# Patient Record
Sex: Female | Born: 1945 | Race: White | Hispanic: No | Marital: Married | Smoking: Former smoker
Health system: Southern US, Community
[De-identification: ages and names within clinical notes are randomized; demographics above are authoritative.]

## PROBLEM LIST (undated history)

## (undated) DIAGNOSIS — I10 Essential (primary) hypertension: Secondary | ICD-10-CM

## (undated) DIAGNOSIS — H409 Unspecified glaucoma: Secondary | ICD-10-CM

## (undated) HISTORY — DX: Essential (primary) hypertension: I10

## (undated) HISTORY — PX: EXTERNAL EAR SURGERY: SHX627

## (undated) HISTORY — PX: TONSILLECTOMY: SUR1361

---

## 2004-05-04 ENCOUNTER — Other Ambulatory Visit: Admission: RE | Admit: 2004-05-04 | Discharge: 2004-05-04 | Payer: Self-pay | Admitting: Family Medicine

## 2011-07-15 ENCOUNTER — Encounter (HOSPITAL_COMMUNITY): Payer: Self-pay | Admitting: Emergency Medicine

## 2011-07-15 ENCOUNTER — Observation Stay (HOSPITAL_COMMUNITY)
Admission: EM | Admit: 2011-07-15 | Discharge: 2011-07-16 | Disposition: A | Discharge status: Home / Self Care | Payer: BC Managed Care – PPO | Source: Ambulatory Visit | Attending: Emergency Medicine | Admitting: Emergency Medicine

## 2011-07-15 ENCOUNTER — Emergency Department (HOSPITAL_COMMUNITY): Payer: BC Managed Care – PPO

## 2011-07-15 DIAGNOSIS — H409 Unspecified glaucoma: Secondary | ICD-10-CM | POA: Insufficient documentation

## 2011-07-15 DIAGNOSIS — R079 Chest pain, unspecified: Principal | ICD-10-CM | POA: Insufficient documentation

## 2011-07-15 HISTORY — DX: Unspecified glaucoma: H40.9

## 2011-07-15 LAB — BASIC METABOLIC PANEL
BUN: 14 mg/dL (ref 6–23)
CO2: 25 mEq/L (ref 19–32)
Chloride: 100 mEq/L (ref 96–112)
Creatinine, Ser: 0.57 mg/dL (ref 0.50–1.10)
Potassium: 3.8 mEq/L (ref 3.5–5.1)

## 2011-07-15 LAB — CBC
HCT: 44 % (ref 36.0–46.0)
Hemoglobin: 14.9 g/dL (ref 12.0–15.0)
MCHC: 33.9 g/dL (ref 30.0–36.0)
MCV: 98.2 fL (ref 78.0–100.0)
RDW: 12.9 % (ref 11.5–15.5)
WBC: 8.1 10*3/uL (ref 4.0–10.5)

## 2011-07-15 LAB — POCT I-STAT TROPONIN I: Troponin i, poc: 0 ng/mL (ref 0.00–0.08)

## 2011-07-15 MED ORDER — ASPIRIN 81 MG PO CHEW
324.0000 mg | CHEWABLE_TABLET | Freq: Once | ORAL | Status: AC
  Administered 2011-07-15: 324 mg via ORAL
  Filled 2011-07-15: qty 4

## 2011-07-15 NOTE — ED Notes (Signed)
The patient states that she has not had any chest discomfort since she has been at the hospital.

## 2011-07-15 NOTE — ED Provider Notes (Signed)
History     CSN: [id number]  Arrival date & time 07/15/11  1627   First MD Initiated Contact with Patient 07/15/11 1736      Chief Complaint  Patient presents with  . Chest Pain    (Consider location/radiation/quality/duration/timing/severity/associated sxs/prior treatment) Patient is a 66 y.o. female presenting with chest pain. The history is provided by the patient and the spouse.  Chest Pain The chest pain began 6 - 12 hours ago. Chest pain occurs intermittently. The chest pain is unchanged. The pain is associated with stress. The severity of the pain is mild. The quality of the pain is described as dull, heavy, brief, pressure-like, squeezing and similar to previous episodes. The pain does not radiate. Pertinent negatives for primary symptoms include no fever, no shortness of breath, no cough, no wheezing, no palpitations, no abdominal pain and no vomiting.  Pertinent negatives for associated symptoms include no diaphoresis, no lower extremity edema, no near-syncope, no orthopnea and no weakness. She tried nothing for the symptoms. Risk factors include post-menopausal.  Pertinent negatives for past medical history include no CAD, no MI, no PE and no seizures.     Past Medical History  Diagnosis Date  . Glaucoma     History reviewed. No pertinent past surgical history.  No family history on file.  History  Substance Use Topics  . Smoking status: Former Games developer  . Smokeless tobacco: Not on file  . Alcohol Use: Yes    OB History    Grav Para Term Preterm Abortions TAB SAB Ect Mult Living                  Review of Systems  Constitutional: Negative for fever, chills and diaphoresis.  HENT: Negative for neck pain and neck stiffness.   Respiratory: Negative for cough, chest tightness, shortness of breath and wheezing.   Cardiovascular: Positive for chest pain. Negative for palpitations, orthopnea, leg swelling and near-syncope.  Gastrointestinal: Negative for vomiting,  abdominal pain, diarrhea and constipation.  Genitourinary: Negative for dysuria, decreased urine volume and difficulty urinating.  Skin: Negative for wound.  Neurological: Negative for seizures, syncope, facial asymmetry, weakness and light-headedness.  Psychiatric/Behavioral: Negative for confusion and agitation.  All other systems reviewed and are negative.    Allergies  Review of patient's allergies indicates no known allergies.  Home Medications   Current Outpatient Rx  Name Route Sig Dispense Refill  . DORZOLAMIDE HCL-TIMOLOL MAL 22.3-6.8 MG/ML OP SOLN Both Eyes Place 1 drop into both eyes 2 (two) times daily.    Marland Kitchen FISH OIL TRIPLE STRENGTH 1400 MG PO CAPS Oral Take 1 capsule by mouth daily.    Marland Kitchen RANITIDINE HCL 150 MG PO TABS Oral Take 150 mg by mouth as needed. For acid reflux    . TRAVOPROST (BAK FREE) 0.004 % OP SOLN Both Eyes Place 1 drop into both eyes at bedtime.      BP 193/93  Pulse 81  Temp(Src) 98.2 F (36.8 C) (Oral)  Resp 16  SpO2 100%  Physical Exam  Nursing note and vitals reviewed. Constitutional: She appears well-developed and well-nourished.  HENT:  Head: Normocephalic and atraumatic.  Right Ear: External ear normal.  Left Ear: External ear normal.  Nose: Nose normal.  Eyes: Conjunctivae are normal. Pupils are equal, round, and reactive to light.  Neck: Normal range of motion. Neck supple.  Cardiovascular: Normal rate, regular rhythm, normal heart sounds and intact distal pulses.  Exam reveals no gallop and no friction rub.  No murmur heard. Pulmonary/Chest: Effort normal and breath sounds normal. No respiratory distress. She has no wheezes. She has no rales. She exhibits no tenderness.  Abdominal: Soft. Bowel sounds are normal. She exhibits no distension. There is no tenderness. There is no rebound and no guarding.  Musculoskeletal: Normal range of motion. She exhibits no edema and no tenderness.  Neurological: She is alert.  Skin: Skin is warm and  dry.  Psychiatric: She has a normal mood and affect. Her behavior is normal. Judgment and thought content normal.    ED Course  Procedures (including critical care time)  Labs Reviewed  BASIC METABOLIC PANEL - Abnormal; Notable for the following:    Glucose, Bld 105 (*)    All other components within normal limits  CBC  POCT I-STAT TROPONIN I  POCT I-STAT TROPONIN I   Dg Chest 2 View  07/15/2011  *RADIOLOGY REPORT*  Clinical Data: Chest pain/heaviness.  CHEST - 2 VIEW  Comparison: None.  Findings: Minimal pectus excavatum deformity. Midline trachea. Normal heart size. No pleural effusion or pneumothorax.  Suspect a small hiatal hernia, with increased right paravertebral density on the frontal. Clear lungs.  IMPRESSION:  1. No acute cardiopulmonary disease. 2.  Suspect a small hiatal hernia.  If there are prior radiographs to confirm stability of this appearance, they should be reviewed. If not, plain film follow-up at 3 months versus further characterization with chest CT should be considered.  Original Report Authenticated By: Consuello Bossier, M.D.     1. Chest pain      Date: 07/15/2011  Rate: 72 bpm  Rhythm: normal sinus rhythm  QRS Axis: normal  Intervals: normal  ST/T Wave abnormalities: normal  Conduction Disutrbances:none  Narrative Interpretation: No evidence of acute ischemia or arrythmia  Old EKG Reviewed: none available    MDM  66 yo F w/no hx of CAD presents after several episodes of intermittent chest tightness; no associated symptoms. ASA 325mg  PO given. EKG not c/w acute ischemia or arrythmia. Initial and 3 hr troponins negative. Pt chest-pain free in ED. Patient transferred to CDU awaiting stress test.          Clemetine Marker, MD 07/16/11 0003

## 2011-07-15 NOTE — ED Notes (Signed)
Intermittent squeezing in center of chest over the past 2 weeks.  Denies sob, nausea, and vomiting.  Reports squeezing pain in center of chest that started 1 hour ago and has improved.

## 2011-07-15 NOTE — ED Notes (Addendum)
Pt resting quietly in room. No c/o acute distress.

## 2011-07-16 DIAGNOSIS — R072 Precordial pain: Secondary | ICD-10-CM

## 2011-07-16 LAB — POCT I-STAT TROPONIN I: Troponin i, poc: 0 ng/mL (ref 0.00–0.08)

## 2011-07-16 NOTE — ED Provider Notes (Signed)
Am ekg  Date: 07/16/2011  Rate: 66  Rhythm: normal sinus rhythm  QRS Axis: normal  Intervals: normal  ST/T Wave abnormalities: normal  Conduction Disutrbances: none  Narrative Interpretation: unremarkable      Hilario Quarry, MD 07/16/11 [phone]

## 2011-07-16 NOTE — ED Notes (Signed)
Pt. Is at Stress Echo, stable upon transfer

## 2011-07-16 NOTE — ED Provider Notes (Signed)
7:34 AM Patient is in CDU under observation, chest pain protocol.  Patient with intermittent chest pressure she thinks is associated with stress.  Family hx mother who had MI at patient's current age.  Workup thus far unremarkable except for possibility of hiatal hernia.  Per nurse, patient was awake all night reading, no acute events and no needs overnight.  Pt currently reports she is feeling well, denies any chest pressure or pain.  On exam, pt is A&Ox4, NAD, RRR, no m/r/g, CTAB, abd soft, NT, extremities without edema, distal pulses intact and equal bilaterally.  Plan is for exercise stress test this morning (pt waiting for husband to bring appropriate shoes).  Will continue to follow.    I have spoken with Dr Eldridge Dace who reports a normal echo.  Confirmed by written report.  Patient is feeling well, denies any chest pressure.  Assures me that this pain has never been exertional.  I have discussed all results with patient including the possible hiatal hernia.  Dr Eldridge Dace asked me to give the patient his number for follow up if she needed it.  I have advised pt to follow up primarily with PCP, and will have Dr Hoyle Barr information as well if needed.  Discussed hiatal hernia - pt does have well controlled GERD, on ranitidine.  Has not seen GI specialist, but will follow up with PCP about this.  Pt given information about chest pain and return precautions.  Patient verbalizes understanding and agrees with plan.    Results for orders placed during the hospital encounter of 07/15/11  BASIC METABOLIC PANEL      Component Value Range   Sodium 138  135 - 145 (mEq/L)   Potassium 3.8  3.5 - 5.1 (mEq/L)   Chloride 100  96 - 112 (mEq/L)   CO2 25  19 - 32 (mEq/L)   Glucose, Bld 105 (*) 70 - 99 (mg/dL)   BUN 14  6 - 23 (mg/dL)   Creatinine, Ser 1.61  0.50 - 1.10 (mg/dL)   Calcium 9.9  8.4 - 09.6 (mg/dL)   GFR calc non Af Amer >90  >90 (mL/min)   GFR calc Af Amer >90  >90 (mL/min)  CBC      Component Value  Range   WBC 8.1  4.0 - 10.5 (K/uL)   RBC 4.48  3.87 - 5.11 (MIL/uL)   Hemoglobin 14.9  12.0 - 15.0 (g/dL)   HCT 04.5  40.9 - 81.1 (%)   MCV 98.2  78.0 - 100.0 (fL)   MCH 33.3  26.0 - 34.0 (pg)   MCHC 33.9  30.0 - 36.0 (g/dL)   RDW 91.4  78.2 - 95.6 (%)   Platelets 259  150 - 400 (K/uL)  POCT I-STAT TROPONIN I      Component Value Range   Troponin i, poc 0.00  0.00 - 0.08 (ng/mL)   Comment 3           POCT I-STAT TROPONIN I      Component Value Range   Troponin i, poc 0.00  0.00 - 0.08 (ng/mL)   Comment 3           POCT I-STAT TROPONIN I      Component Value Range   Troponin i, poc 0.00  0.00 - 0.08 (ng/mL)   Comment 3            Dg Chest 2 View  07/15/2011  *RADIOLOGY REPORT*  Clinical Data: Chest pain/heaviness.  CHEST - 2 VIEW  Comparison: None.  Findings: Minimal pectus excavatum deformity. Midline trachea. Normal heart size. No pleural effusion or pneumothorax.  Suspect a small hiatal hernia, with increased right paravertebral density on the frontal. Clear lungs.  IMPRESSION:  1. No acute cardiopulmonary disease. 2.  Suspect a small hiatal hernia.  If there are prior radiographs to confirm stability of this appearance, they should be reviewed. If not, plain film follow-up at 3 months versus further characterization with chest CT should be considered.  Original Report Authenticated By: Consuello Bossier, M.D.      Rise Patience, [place] 07/16/11 1314

## 2011-07-16 NOTE — ED Notes (Addendum)
Pt. Returned from [employer]

## 2011-07-16 NOTE — Discharge Instructions (Signed)
Read the information below.  Please follow up with your primary care provider. You may return to the ER at any time for worsening condition or any new symptoms that concern you. 

## 2011-07-16 NOTE — ED Provider Notes (Signed)
I saw and evaluated the patient, reviewed the resident's note and I agree with the findings and plan.  Intermittent chest heaviness and tightness thought to be associated with stress.  Pain free now.  EKG nonischemic. Risk factors for ACS: age, female gender, former smoker.  Glynn Octave, MD 07/16/11 1021

## 2011-07-16 NOTE — Progress Notes (Signed)
*  PRELIMINARY RESULTS* Echocardiogram 2D Echocardiogram has been performed.  Jeryl Columbia R 07/16/2011, 10:06 AM

## 2011-07-17 NOTE — ED Provider Notes (Signed)
History/physical exam/procedure(s) were performed by non-physician practitioner and as supervising physician I was immediately available for consultation/collaboration. I have reviewed all notes and am in agreement with care and plan.   Kianna Billet S Shyan Scalisi, MD 07/17/11 0058 

## 2011-07-23 ENCOUNTER — Other Ambulatory Visit (HOSPITAL_COMMUNITY)
Admission: RE | Admit: 2011-07-23 | Discharge: 2011-07-23 | Disposition: A | Discharge status: Home / Self Care | Payer: BC Managed Care – PPO | Source: Ambulatory Visit | Attending: Family Medicine | Admitting: Family Medicine

## 2011-07-23 ENCOUNTER — Other Ambulatory Visit: Payer: Self-pay | Admitting: Family Medicine

## 2011-07-23 DIAGNOSIS — Z01419 Encounter for gynecological examination (general) (routine) without abnormal findings: Secondary | ICD-10-CM | POA: Insufficient documentation

## 2013-11-29 ENCOUNTER — Encounter: Payer: Self-pay | Admitting: *Deleted

## 2014-03-29 DIAGNOSIS — Z1231 Encounter for screening mammogram for malignant neoplasm of breast: Secondary | ICD-10-CM | POA: Diagnosis not present

## 2014-03-31 DIAGNOSIS — H34812 Central retinal vein occlusion, left eye: Secondary | ICD-10-CM | POA: Diagnosis not present

## 2014-03-31 DIAGNOSIS — H4011X3 Primary open-angle glaucoma, severe stage: Secondary | ICD-10-CM | POA: Diagnosis not present

## 2014-03-31 DIAGNOSIS — H4011X1 Primary open-angle glaucoma, mild stage: Secondary | ICD-10-CM | POA: Diagnosis not present

## 2014-03-31 DIAGNOSIS — H2513 Age-related nuclear cataract, bilateral: Secondary | ICD-10-CM | POA: Diagnosis not present

## 2014-05-05 DIAGNOSIS — J0100 Acute maxillary sinusitis, unspecified: Secondary | ICD-10-CM | POA: Diagnosis not present

## 2014-07-15 DIAGNOSIS — H4011X3 Primary open-angle glaucoma, severe stage: Secondary | ICD-10-CM | POA: Diagnosis not present

## 2014-08-03 DIAGNOSIS — Z0100 Encounter for examination of eyes and vision without abnormal findings: Secondary | ICD-10-CM | POA: Diagnosis not present

## 2014-08-03 DIAGNOSIS — H5210 Myopia, unspecified eye: Secondary | ICD-10-CM | POA: Diagnosis not present

## 2014-08-03 DIAGNOSIS — H5213 Myopia, bilateral: Secondary | ICD-10-CM | POA: Diagnosis not present

## 2014-08-10 DIAGNOSIS — H4011X1 Primary open-angle glaucoma, mild stage: Secondary | ICD-10-CM | POA: Diagnosis not present

## 2014-08-10 DIAGNOSIS — H34812 Central retinal vein occlusion, left eye: Secondary | ICD-10-CM | POA: Diagnosis not present

## 2014-08-10 DIAGNOSIS — H2513 Age-related nuclear cataract, bilateral: Secondary | ICD-10-CM | POA: Diagnosis not present

## 2014-08-10 DIAGNOSIS — H4011X3 Primary open-angle glaucoma, severe stage: Secondary | ICD-10-CM | POA: Diagnosis not present

## 2014-10-25 DIAGNOSIS — H4011X1 Primary open-angle glaucoma, mild stage: Secondary | ICD-10-CM | POA: Diagnosis not present

## 2014-10-25 DIAGNOSIS — H4011X3 Primary open-angle glaucoma, severe stage: Secondary | ICD-10-CM | POA: Diagnosis not present

## 2015-04-12 DIAGNOSIS — H401123 Primary open-angle glaucoma, left eye, severe stage: Secondary | ICD-10-CM | POA: Diagnosis not present

## 2015-04-12 DIAGNOSIS — H35012 Changes in retinal vascular appearance, left eye: Secondary | ICD-10-CM | POA: Diagnosis not present

## 2015-04-12 DIAGNOSIS — H2513 Age-related nuclear cataract, bilateral: Secondary | ICD-10-CM | POA: Diagnosis not present

## 2015-04-12 DIAGNOSIS — H401111 Primary open-angle glaucoma, right eye, mild stage: Secondary | ICD-10-CM | POA: Diagnosis not present

## 2015-04-12 DIAGNOSIS — H348122 Central retinal vein occlusion, left eye, stable: Secondary | ICD-10-CM | POA: Diagnosis not present

## 2015-04-12 DIAGNOSIS — H35033 Hypertensive retinopathy, bilateral: Secondary | ICD-10-CM | POA: Diagnosis not present

## 2015-04-12 DIAGNOSIS — H4312 Vitreous hemorrhage, left eye: Secondary | ICD-10-CM | POA: Diagnosis not present

## 2015-04-25 DIAGNOSIS — H9042 Sensorineural hearing loss, unilateral, left ear, with unrestricted hearing on the contralateral side: Secondary | ICD-10-CM | POA: Diagnosis not present

## 2015-05-02 DIAGNOSIS — H905 Unspecified sensorineural hearing loss: Secondary | ICD-10-CM | POA: Diagnosis not present

## 2015-05-12 DIAGNOSIS — H401111 Primary open-angle glaucoma, right eye, mild stage: Secondary | ICD-10-CM | POA: Diagnosis not present

## 2015-05-12 DIAGNOSIS — H34812 Central retinal vein occlusion, left eye, with macular edema: Secondary | ICD-10-CM | POA: Diagnosis not present

## 2015-05-12 DIAGNOSIS — H2513 Age-related nuclear cataract, bilateral: Secondary | ICD-10-CM | POA: Diagnosis not present

## 2015-05-12 DIAGNOSIS — H4312 Vitreous hemorrhage, left eye: Secondary | ICD-10-CM | POA: Diagnosis not present

## 2015-05-30 DIAGNOSIS — Z0100 Encounter for examination of eyes and vision without abnormal findings: Secondary | ICD-10-CM | POA: Diagnosis not present

## 2015-06-06 DIAGNOSIS — H9042 Sensorineural hearing loss, unilateral, left ear, with unrestricted hearing on the contralateral side: Secondary | ICD-10-CM | POA: Diagnosis not present

## 2015-07-11 DIAGNOSIS — H401111 Primary open-angle glaucoma, right eye, mild stage: Secondary | ICD-10-CM | POA: Diagnosis not present

## 2015-07-11 DIAGNOSIS — H401123 Primary open-angle glaucoma, left eye, severe stage: Secondary | ICD-10-CM | POA: Diagnosis not present

## 2015-07-28 DIAGNOSIS — H4312 Vitreous hemorrhage, left eye: Secondary | ICD-10-CM | POA: Diagnosis not present

## 2015-07-28 DIAGNOSIS — H34812 Central retinal vein occlusion, left eye, with macular edema: Secondary | ICD-10-CM | POA: Diagnosis not present

## 2015-07-28 DIAGNOSIS — H401111 Primary open-angle glaucoma, right eye, mild stage: Secondary | ICD-10-CM | POA: Diagnosis not present

## 2015-07-28 DIAGNOSIS — H401123 Primary open-angle glaucoma, left eye, severe stage: Secondary | ICD-10-CM | POA: Diagnosis not present

## 2015-07-28 DIAGNOSIS — H2513 Age-related nuclear cataract, bilateral: Secondary | ICD-10-CM | POA: Diagnosis not present

## 2015-08-01 DIAGNOSIS — Z1231 Encounter for screening mammogram for malignant neoplasm of breast: Secondary | ICD-10-CM | POA: Diagnosis not present

## 2015-08-11 DIAGNOSIS — L989 Disorder of the skin and subcutaneous tissue, unspecified: Secondary | ICD-10-CM | POA: Diagnosis not present

## 2015-09-02 DIAGNOSIS — D1801 Hemangioma of skin and subcutaneous tissue: Secondary | ICD-10-CM | POA: Diagnosis not present

## 2015-09-02 DIAGNOSIS — L578 Other skin changes due to chronic exposure to nonionizing radiation: Secondary | ICD-10-CM | POA: Diagnosis not present

## 2015-09-02 DIAGNOSIS — L570 Actinic keratosis: Secondary | ICD-10-CM | POA: Diagnosis not present

## 2015-09-02 DIAGNOSIS — L218 Other seborrheic dermatitis: Secondary | ICD-10-CM | POA: Diagnosis not present

## 2015-11-03 DIAGNOSIS — H34812 Central retinal vein occlusion, left eye, with macular edema: Secondary | ICD-10-CM | POA: Diagnosis not present

## 2015-11-03 DIAGNOSIS — H2513 Age-related nuclear cataract, bilateral: Secondary | ICD-10-CM | POA: Diagnosis not present

## 2015-11-03 DIAGNOSIS — H401111 Primary open-angle glaucoma, right eye, mild stage: Secondary | ICD-10-CM | POA: Diagnosis not present

## 2015-11-03 DIAGNOSIS — H401123 Primary open-angle glaucoma, left eye, severe stage: Secondary | ICD-10-CM | POA: Diagnosis not present

## 2015-11-03 DIAGNOSIS — H4312 Vitreous hemorrhage, left eye: Secondary | ICD-10-CM | POA: Diagnosis not present

## 2015-12-09 DIAGNOSIS — H401111 Primary open-angle glaucoma, right eye, mild stage: Secondary | ICD-10-CM | POA: Diagnosis not present

## 2015-12-09 DIAGNOSIS — H401123 Primary open-angle glaucoma, left eye, severe stage: Secondary | ICD-10-CM | POA: Diagnosis not present

## 2016-03-08 DIAGNOSIS — H4312 Vitreous hemorrhage, left eye: Secondary | ICD-10-CM | POA: Diagnosis not present

## 2016-03-08 DIAGNOSIS — H401123 Primary open-angle glaucoma, left eye, severe stage: Secondary | ICD-10-CM | POA: Diagnosis not present

## 2016-03-08 DIAGNOSIS — H401111 Primary open-angle glaucoma, right eye, mild stage: Secondary | ICD-10-CM | POA: Diagnosis not present

## 2016-03-08 DIAGNOSIS — H35033 Hypertensive retinopathy, bilateral: Secondary | ICD-10-CM | POA: Diagnosis not present

## 2016-03-08 DIAGNOSIS — H34812 Central retinal vein occlusion, left eye, with macular edema: Secondary | ICD-10-CM | POA: Diagnosis not present

## 2016-04-12 DIAGNOSIS — H34812 Central retinal vein occlusion, left eye, with macular edema: Secondary | ICD-10-CM | POA: Diagnosis not present

## 2016-05-24 DIAGNOSIS — H34812 Central retinal vein occlusion, left eye, with macular edema: Secondary | ICD-10-CM | POA: Diagnosis not present

## 2016-07-19 DIAGNOSIS — H34812 Central retinal vein occlusion, left eye, with macular edema: Secondary | ICD-10-CM | POA: Diagnosis not present

## 2016-08-03 DIAGNOSIS — Z1231 Encounter for screening mammogram for malignant neoplasm of breast: Secondary | ICD-10-CM | POA: Diagnosis not present

## 2016-08-03 DIAGNOSIS — M810 Age-related osteoporosis without current pathological fracture: Secondary | ICD-10-CM | POA: Diagnosis not present

## 2016-08-27 DIAGNOSIS — M810 Age-related osteoporosis without current pathological fracture: Secondary | ICD-10-CM | POA: Diagnosis not present

## 2016-08-27 DIAGNOSIS — E785 Hyperlipidemia, unspecified: Secondary | ICD-10-CM | POA: Diagnosis not present

## 2016-09-10 DIAGNOSIS — H524 Presbyopia: Secondary | ICD-10-CM | POA: Diagnosis not present

## 2016-09-18 DIAGNOSIS — M810 Age-related osteoporosis without current pathological fracture: Secondary | ICD-10-CM | POA: Diagnosis not present

## 2016-10-04 DIAGNOSIS — H34812 Central retinal vein occlusion, left eye, with macular edema: Secondary | ICD-10-CM | POA: Diagnosis not present

## 2016-10-30 DIAGNOSIS — Z23 Encounter for immunization: Secondary | ICD-10-CM | POA: Diagnosis not present

## 2016-10-30 DIAGNOSIS — E559 Vitamin D deficiency, unspecified: Secondary | ICD-10-CM | POA: Diagnosis not present

## 2016-10-30 DIAGNOSIS — M810 Age-related osteoporosis without current pathological fracture: Secondary | ICD-10-CM | POA: Diagnosis not present

## 2016-12-06 DIAGNOSIS — H35372 Puckering of macula, left eye: Secondary | ICD-10-CM | POA: Diagnosis not present

## 2016-12-06 DIAGNOSIS — H34812 Central retinal vein occlusion, left eye, with macular edema: Secondary | ICD-10-CM | POA: Diagnosis not present

## 2017-02-07 DIAGNOSIS — H35372 Puckering of macula, left eye: Secondary | ICD-10-CM | POA: Diagnosis not present

## 2017-02-07 DIAGNOSIS — H401111 Primary open-angle glaucoma, right eye, mild stage: Secondary | ICD-10-CM | POA: Diagnosis not present

## 2017-02-07 DIAGNOSIS — H34812 Central retinal vein occlusion, left eye, with macular edema: Secondary | ICD-10-CM | POA: Diagnosis not present

## 2017-02-07 DIAGNOSIS — H401123 Primary open-angle glaucoma, left eye, severe stage: Secondary | ICD-10-CM | POA: Diagnosis not present

## 2017-03-22 DIAGNOSIS — M810 Age-related osteoporosis without current pathological fracture: Secondary | ICD-10-CM | POA: Diagnosis not present

## 2017-04-11 DIAGNOSIS — H34812 Central retinal vein occlusion, left eye, with macular edema: Secondary | ICD-10-CM | POA: Diagnosis not present

## 2017-04-11 DIAGNOSIS — H25813 Combined forms of age-related cataract, bilateral: Secondary | ICD-10-CM | POA: Diagnosis not present

## 2017-04-11 DIAGNOSIS — H401123 Primary open-angle glaucoma, left eye, severe stage: Secondary | ICD-10-CM | POA: Diagnosis not present

## 2017-04-11 DIAGNOSIS — H401111 Primary open-angle glaucoma, right eye, mild stage: Secondary | ICD-10-CM | POA: Diagnosis not present

## 2017-04-11 DIAGNOSIS — H35372 Puckering of macula, left eye: Secondary | ICD-10-CM | POA: Diagnosis not present

## 2017-05-13 DIAGNOSIS — H401123 Primary open-angle glaucoma, left eye, severe stage: Secondary | ICD-10-CM | POA: Diagnosis not present

## 2017-05-13 DIAGNOSIS — H401111 Primary open-angle glaucoma, right eye, mild stage: Secondary | ICD-10-CM | POA: Diagnosis not present

## 2017-06-06 DIAGNOSIS — H34812 Central retinal vein occlusion, left eye, with macular edema: Secondary | ICD-10-CM | POA: Diagnosis not present

## 2017-07-08 DIAGNOSIS — H401111 Primary open-angle glaucoma, right eye, mild stage: Secondary | ICD-10-CM | POA: Diagnosis not present

## 2017-07-08 DIAGNOSIS — H401123 Primary open-angle glaucoma, left eye, severe stage: Secondary | ICD-10-CM | POA: Diagnosis not present

## 2017-07-25 DIAGNOSIS — H25813 Combined forms of age-related cataract, bilateral: Secondary | ICD-10-CM | POA: Diagnosis not present

## 2017-07-25 DIAGNOSIS — H34812 Central retinal vein occlusion, left eye, with macular edema: Secondary | ICD-10-CM | POA: Diagnosis not present

## 2017-07-25 DIAGNOSIS — H5010 Unspecified exotropia: Secondary | ICD-10-CM | POA: Diagnosis not present

## 2017-08-19 DIAGNOSIS — Z1231 Encounter for screening mammogram for malignant neoplasm of breast: Secondary | ICD-10-CM | POA: Diagnosis not present

## 2017-08-22 DIAGNOSIS — R928 Other abnormal and inconclusive findings on diagnostic imaging of breast: Secondary | ICD-10-CM | POA: Diagnosis not present

## 2017-09-24 DIAGNOSIS — M810 Age-related osteoporosis without current pathological fracture: Secondary | ICD-10-CM | POA: Diagnosis not present

## 2017-10-03 DIAGNOSIS — H34812 Central retinal vein occlusion, left eye, with macular edema: Secondary | ICD-10-CM | POA: Diagnosis not present

## 2017-10-03 DIAGNOSIS — H35033 Hypertensive retinopathy, bilateral: Secondary | ICD-10-CM | POA: Diagnosis not present

## 2017-11-11 DIAGNOSIS — H401123 Primary open-angle glaucoma, left eye, severe stage: Secondary | ICD-10-CM | POA: Diagnosis not present

## 2017-11-11 DIAGNOSIS — H401111 Primary open-angle glaucoma, right eye, mild stage: Secondary | ICD-10-CM | POA: Diagnosis not present

## 2017-11-12 DIAGNOSIS — M810 Age-related osteoporosis without current pathological fracture: Secondary | ICD-10-CM | POA: Diagnosis not present

## 2017-11-14 DIAGNOSIS — H5210 Myopia, unspecified eye: Secondary | ICD-10-CM | POA: Diagnosis not present

## 2017-12-12 DIAGNOSIS — H35372 Puckering of macula, left eye: Secondary | ICD-10-CM | POA: Diagnosis not present

## 2017-12-12 DIAGNOSIS — H34812 Central retinal vein occlusion, left eye, with macular edema: Secondary | ICD-10-CM | POA: Diagnosis not present

## 2017-12-23 DIAGNOSIS — H401111 Primary open-angle glaucoma, right eye, mild stage: Secondary | ICD-10-CM | POA: Diagnosis not present

## 2017-12-23 DIAGNOSIS — H401123 Primary open-angle glaucoma, left eye, severe stage: Secondary | ICD-10-CM | POA: Diagnosis not present

## 2018-02-17 DIAGNOSIS — H401111 Primary open-angle glaucoma, right eye, mild stage: Secondary | ICD-10-CM | POA: Diagnosis not present

## 2018-02-17 DIAGNOSIS — H401123 Primary open-angle glaucoma, left eye, severe stage: Secondary | ICD-10-CM | POA: Diagnosis not present

## 2018-02-20 DIAGNOSIS — H908 Mixed conductive and sensorineural hearing loss, unspecified: Secondary | ICD-10-CM | POA: Diagnosis not present

## 2018-02-20 DIAGNOSIS — H43821 Vitreomacular adhesion, right eye: Secondary | ICD-10-CM | POA: Diagnosis not present

## 2018-02-20 DIAGNOSIS — H34812 Central retinal vein occlusion, left eye, with macular edema: Secondary | ICD-10-CM | POA: Diagnosis not present

## 2018-03-31 DIAGNOSIS — M810 Age-related osteoporosis without current pathological fracture: Secondary | ICD-10-CM | POA: Diagnosis not present

## 2018-04-03 DIAGNOSIS — H401111 Primary open-angle glaucoma, right eye, mild stage: Secondary | ICD-10-CM | POA: Diagnosis not present

## 2018-05-08 DIAGNOSIS — H34812 Central retinal vein occlusion, left eye, with macular edema: Secondary | ICD-10-CM | POA: Diagnosis not present

## 2018-06-23 DIAGNOSIS — H401111 Primary open-angle glaucoma, right eye, mild stage: Secondary | ICD-10-CM | POA: Diagnosis not present

## 2018-06-23 DIAGNOSIS — H34812 Central retinal vein occlusion, left eye, with macular edema: Secondary | ICD-10-CM | POA: Diagnosis not present

## 2018-06-23 DIAGNOSIS — H401123 Primary open-angle glaucoma, left eye, severe stage: Secondary | ICD-10-CM | POA: Diagnosis not present

## 2018-08-07 DIAGNOSIS — H5010 Unspecified exotropia: Secondary | ICD-10-CM | POA: Diagnosis not present

## 2018-08-07 DIAGNOSIS — H401111 Primary open-angle glaucoma, right eye, mild stage: Secondary | ICD-10-CM | POA: Diagnosis not present

## 2018-08-07 DIAGNOSIS — H4312 Vitreous hemorrhage, left eye: Secondary | ICD-10-CM | POA: Diagnosis not present

## 2018-08-07 DIAGNOSIS — H34812 Central retinal vein occlusion, left eye, with macular edema: Secondary | ICD-10-CM | POA: Diagnosis not present

## 2018-08-07 DIAGNOSIS — H401123 Primary open-angle glaucoma, left eye, severe stage: Secondary | ICD-10-CM | POA: Diagnosis not present

## 2018-08-07 DIAGNOSIS — H25813 Combined forms of age-related cataract, bilateral: Secondary | ICD-10-CM | POA: Diagnosis not present

## 2018-08-29 DIAGNOSIS — Z1231 Encounter for screening mammogram for malignant neoplasm of breast: Secondary | ICD-10-CM | POA: Diagnosis not present

## 2018-08-29 DIAGNOSIS — M810 Age-related osteoporosis without current pathological fracture: Secondary | ICD-10-CM | POA: Diagnosis not present

## 2018-09-04 DIAGNOSIS — N6311 Unspecified lump in the right breast, upper outer quadrant: Secondary | ICD-10-CM | POA: Diagnosis not present

## 2018-09-16 ENCOUNTER — Other Ambulatory Visit: Payer: Self-pay | Admitting: Radiology

## 2018-09-16 DIAGNOSIS — D241 Benign neoplasm of right breast: Secondary | ICD-10-CM | POA: Diagnosis not present

## 2018-09-17 DIAGNOSIS — N6311 Unspecified lump in the right breast, upper outer quadrant: Secondary | ICD-10-CM | POA: Diagnosis not present

## 2018-11-13 DIAGNOSIS — H34812 Central retinal vein occlusion, left eye, with macular edema: Secondary | ICD-10-CM | POA: Diagnosis not present

## 2018-11-13 DIAGNOSIS — H5010 Unspecified exotropia: Secondary | ICD-10-CM | POA: Diagnosis not present

## 2018-11-13 DIAGNOSIS — H4312 Vitreous hemorrhage, left eye: Secondary | ICD-10-CM | POA: Diagnosis not present

## 2018-11-13 DIAGNOSIS — H401111 Primary open-angle glaucoma, right eye, mild stage: Secondary | ICD-10-CM | POA: Diagnosis not present

## 2018-11-13 DIAGNOSIS — H401123 Primary open-angle glaucoma, left eye, severe stage: Secondary | ICD-10-CM | POA: Diagnosis not present

## 2018-11-13 DIAGNOSIS — H25813 Combined forms of age-related cataract, bilateral: Secondary | ICD-10-CM | POA: Diagnosis not present

## 2018-12-08 DIAGNOSIS — H401111 Primary open-angle glaucoma, right eye, mild stage: Secondary | ICD-10-CM | POA: Diagnosis not present

## 2018-12-08 DIAGNOSIS — H401123 Primary open-angle glaucoma, left eye, severe stage: Secondary | ICD-10-CM | POA: Diagnosis not present

## 2019-01-08 DIAGNOSIS — H401123 Primary open-angle glaucoma, left eye, severe stage: Secondary | ICD-10-CM | POA: Diagnosis not present

## 2019-02-02 DIAGNOSIS — M810 Age-related osteoporosis without current pathological fracture: Secondary | ICD-10-CM | POA: Diagnosis not present

## 2019-02-19 DIAGNOSIS — H401111 Primary open-angle glaucoma, right eye, mild stage: Secondary | ICD-10-CM | POA: Diagnosis not present

## 2019-02-19 DIAGNOSIS — H4312 Vitreous hemorrhage, left eye: Secondary | ICD-10-CM | POA: Diagnosis not present

## 2019-02-19 DIAGNOSIS — H34812 Central retinal vein occlusion, left eye, with macular edema: Secondary | ICD-10-CM | POA: Diagnosis not present

## 2019-02-19 DIAGNOSIS — H5010 Unspecified exotropia: Secondary | ICD-10-CM | POA: Diagnosis not present

## 2019-02-19 DIAGNOSIS — H401123 Primary open-angle glaucoma, left eye, severe stage: Secondary | ICD-10-CM | POA: Diagnosis not present

## 2019-03-04 DIAGNOSIS — H401111 Primary open-angle glaucoma, right eye, mild stage: Secondary | ICD-10-CM | POA: Diagnosis not present

## 2019-03-04 DIAGNOSIS — H401123 Primary open-angle glaucoma, left eye, severe stage: Secondary | ICD-10-CM | POA: Diagnosis not present

## 2019-03-06 ENCOUNTER — Ambulatory Visit: Payer: Self-pay

## 2019-03-11 ENCOUNTER — Ambulatory Visit: Payer: Self-pay

## 2019-03-13 ENCOUNTER — Ambulatory Visit: Payer: Medicare HMO | Attending: Internal Medicine

## 2019-03-13 DIAGNOSIS — Z23 Encounter for immunization: Secondary | ICD-10-CM

## 2019-03-13 NOTE — Progress Notes (Signed)
   Covid-19 Vaccination Clinic  Name:  Angela Jenkins    MRN: [id number] DOB: 1945-09-29  03/13/2019  [name] was observed post Covid-19 immunization for 15 minutes without incidence. She was provided with Vaccine Information Sheet and instruction to access the V-Safe system.   [name] was instructed to call 911 with any severe reactions post vaccine: Marland Kitchen Difficulty breathing  . Swelling of your face and throat  . A fast heartbeat  . A bad rash all over your body  . Dizziness and weakness    Immunizations Administered    Name Date Dose VIS Date Route   Pfizer COVID-19 Vaccine 03/13/2019  1:03 PM 0.3 mL 01/16/2019 Intramuscular   Manufacturer: [place]   Lot: [id number]   [place]: [id number]

## 2019-04-08 ENCOUNTER — Ambulatory Visit: Payer: Medicare HMO | Attending: Internal Medicine

## 2019-04-08 DIAGNOSIS — Z23 Encounter for immunization: Secondary | ICD-10-CM | POA: Insufficient documentation

## 2019-04-08 NOTE — Progress Notes (Signed)
   Covid-19 Vaccination Clinic  Name:  Angela Jenkins    MRN: [id number] DOB: 1945/07/05  04/08/2019  [name] was observed post Covid-19 immunization for 15 minutes without incident. She was provided with Vaccine Information Sheet and instruction to access the V-Safe system.   [name] was instructed to call 911 with any severe reactions post vaccine: Marland Kitchen Difficulty breathing  . Swelling of face and throat  . A fast heartbeat  . A bad rash all over body  . Dizziness and weakness   Immunizations Administered    Name Date Dose VIS Date Route   Pfizer COVID-19 Vaccine 04/08/2019  8:18 AM 0.3 mL 01/16/2019 Intramuscular   Manufacturer: [place]   Lot: [id number]   [place]: [id number]

## 2019-05-29 DIAGNOSIS — Z135 Encounter for screening for eye and ear disorders: Secondary | ICD-10-CM | POA: Diagnosis not present

## 2019-05-29 DIAGNOSIS — H259 Unspecified age-related cataract: Secondary | ICD-10-CM | POA: Diagnosis not present

## 2019-05-29 DIAGNOSIS — Z0100 Encounter for examination of eyes and vision without abnormal findings: Secondary | ICD-10-CM | POA: Diagnosis not present

## 2019-05-29 DIAGNOSIS — H40009 Preglaucoma, unspecified, unspecified eye: Secondary | ICD-10-CM | POA: Diagnosis not present

## 2019-06-09 DIAGNOSIS — Z0100 Encounter for examination of eyes and vision without abnormal findings: Secondary | ICD-10-CM | POA: Diagnosis not present

## 2019-08-04 DIAGNOSIS — M810 Age-related osteoporosis without current pathological fracture: Secondary | ICD-10-CM | POA: Diagnosis not present

## 2019-08-13 DIAGNOSIS — H401123 Primary open-angle glaucoma, left eye, severe stage: Secondary | ICD-10-CM | POA: Diagnosis not present

## 2019-08-13 DIAGNOSIS — H401111 Primary open-angle glaucoma, right eye, mild stage: Secondary | ICD-10-CM | POA: Diagnosis not present

## 2019-09-03 DIAGNOSIS — H5010 Unspecified exotropia: Secondary | ICD-10-CM | POA: Diagnosis not present

## 2019-09-03 DIAGNOSIS — H4312 Vitreous hemorrhage, left eye: Secondary | ICD-10-CM | POA: Diagnosis not present

## 2019-09-03 DIAGNOSIS — H401111 Primary open-angle glaucoma, right eye, mild stage: Secondary | ICD-10-CM | POA: Diagnosis not present

## 2019-09-03 DIAGNOSIS — H25813 Combined forms of age-related cataract, bilateral: Secondary | ICD-10-CM | POA: Diagnosis not present

## 2019-09-03 DIAGNOSIS — H34812 Central retinal vein occlusion, left eye, with macular edema: Secondary | ICD-10-CM | POA: Diagnosis not present

## 2019-09-03 DIAGNOSIS — H401123 Primary open-angle glaucoma, left eye, severe stage: Secondary | ICD-10-CM | POA: Diagnosis not present

## 2019-09-14 DIAGNOSIS — R928 Other abnormal and inconclusive findings on diagnostic imaging of breast: Secondary | ICD-10-CM | POA: Diagnosis not present

## 2019-10-19 DIAGNOSIS — H401111 Primary open-angle glaucoma, right eye, mild stage: Secondary | ICD-10-CM | POA: Diagnosis not present

## 2019-10-19 DIAGNOSIS — H401123 Primary open-angle glaucoma, left eye, severe stage: Secondary | ICD-10-CM | POA: Diagnosis not present

## 2019-10-19 DIAGNOSIS — H5010 Unspecified exotropia: Secondary | ICD-10-CM | POA: Diagnosis not present

## 2019-11-27 DIAGNOSIS — H348122 Central retinal vein occlusion, left eye, stable: Secondary | ICD-10-CM | POA: Diagnosis not present

## 2019-11-27 DIAGNOSIS — H5010 Unspecified exotropia: Secondary | ICD-10-CM | POA: Diagnosis not present

## 2019-11-27 DIAGNOSIS — H401111 Primary open-angle glaucoma, right eye, mild stage: Secondary | ICD-10-CM | POA: Diagnosis not present

## 2019-11-27 DIAGNOSIS — H25813 Combined forms of age-related cataract, bilateral: Secondary | ICD-10-CM | POA: Diagnosis not present

## 2019-11-27 DIAGNOSIS — H401123 Primary open-angle glaucoma, left eye, severe stage: Secondary | ICD-10-CM | POA: Diagnosis not present

## 2019-11-29 ENCOUNTER — Encounter (HOSPITAL_COMMUNITY): Payer: Self-pay

## 2019-11-29 ENCOUNTER — Emergency Department (HOSPITAL_COMMUNITY): Payer: Medicare HMO

## 2019-11-29 ENCOUNTER — Other Ambulatory Visit: Payer: Self-pay

## 2019-11-29 ENCOUNTER — Emergency Department (HOSPITAL_COMMUNITY)
Admission: EM | Admit: 2019-11-29 | Discharge: 2019-11-29 | Disposition: A | Discharge status: Home / Self Care | Payer: Medicare HMO | Attending: Emergency Medicine | Admitting: Emergency Medicine

## 2019-11-29 DIAGNOSIS — Z87891 Personal history of nicotine dependence: Secondary | ICD-10-CM | POA: Insufficient documentation

## 2019-11-29 DIAGNOSIS — R52 Pain, unspecified: Secondary | ICD-10-CM | POA: Diagnosis not present

## 2019-11-29 DIAGNOSIS — S2241XA Multiple fractures of ribs, right side, initial encounter for closed fracture: Secondary | ICD-10-CM | POA: Insufficient documentation

## 2019-11-29 DIAGNOSIS — W010XXA Fall on same level from slipping, tripping and stumbling without subsequent striking against object, initial encounter: Secondary | ICD-10-CM | POA: Diagnosis not present

## 2019-11-29 DIAGNOSIS — S299XXA Unspecified injury of thorax, initial encounter: Secondary | ICD-10-CM | POA: Diagnosis present

## 2019-11-29 DIAGNOSIS — W19XXXA Unspecified fall, initial encounter: Secondary | ICD-10-CM

## 2019-11-29 DIAGNOSIS — I10 Essential (primary) hypertension: Secondary | ICD-10-CM | POA: Diagnosis not present

## 2019-11-29 DIAGNOSIS — R0781 Pleurodynia: Secondary | ICD-10-CM | POA: Diagnosis not present

## 2019-11-29 LAB — BASIC METABOLIC PANEL
Anion gap: 13 (ref 5–15)
BUN: 9 mg/dL (ref 8–23)
CO2: 27 mmol/L (ref 22–32)
Calcium: 9.4 mg/dL (ref 8.9–10.3)
Chloride: 102 mmol/L (ref 98–111)
Creatinine, Ser: 0.6 mg/dL (ref 0.44–1.00)
GFR, Estimated: >60 mL/min (ref >60)
Glucose, Bld: 110 mg/dL — ABNORMAL HIGH (ref 70–99)
Potassium: 4.2 mmol/L (ref 3.5–5.1)
Sodium: 142 mmol/L (ref 135–145)

## 2019-11-29 LAB — CBC
HCT: 46.6 % — ABNORMAL HIGH (ref 36.0–46.0)
Hemoglobin: 15.4 g/dL — ABNORMAL HIGH (ref 12.0–15.0)
MCH: 34 pg (ref 26.0–34.0)
MCHC: 33 g/dL (ref 30.0–36.0)
MCV: 102.9 fL — ABNORMAL HIGH (ref 80.0–100.0)
Platelets: 275 10*3/uL (ref 150–400)
RBC: 4.53 MIL/uL (ref 3.87–5.11)
RDW: 12.7 % (ref 11.5–15.5)
WBC: 8.5 10*3/uL (ref 4.0–10.5)
nRBC: 0 % (ref 0.0–0.2)

## 2019-11-29 MED ORDER — HYDROCODONE-ACETAMINOPHEN 5-325 MG PO TABS: 1.0000 | ORAL_TABLET | Freq: Four times a day (QID) | ORAL | 0 refills | Status: DC | PRN

## 2019-11-29 MED ORDER — HYDROCODONE-ACETAMINOPHEN 5-325 MG PO TABS
1.0000 | ORAL_TABLET | Freq: Once | ORAL | Status: AC
  Administered 2019-11-29: 1 via ORAL
  Filled 2019-11-29: qty 1

## 2019-11-29 NOTE — ED Provider Notes (Signed)
[facility] DEPT Provider Note   CSN: [id number] Arrival date & time: 11/29/19  1021     History Chief Complaint  Patient presents with  . Rib Injury    Angela Jenkins is a 74 y.o. female presented for evaluation of right-sided rib pain.  Patient states 3 days ago she was taking a shower when she lost balance and fell, landing on her right side.  She not hit her head or lose consciousness.  She reports no acute pain until the next day when she developed right-sided rib pain.  She denies headache, vision changes, slurred speech, neck pain, midline back pain, L sided chest pain, shortness of breath, nausea, vomiting abdominal pain, urinary symptoms, abnormal bowel movements.  She denies any numbness or tingling.  She is not on blood thinners.  She has been taking ibuprofen for pain without improvement.  She went to medical urgent care with an x-ray and saw several displaced rib fractures and sent her to the ER for further evaluation.  Patient states her pain today is better than yesterday.  She has had no difficulty getting around, speaking, eating.  No increased pain with inspiration.  No fevers, chills, cough.  HPI     Past Medical History:  Diagnosis Date  . Glaucoma   . Hypertension     There are no problems to display for this patient.   Past Surgical History:  Procedure Laterality Date  . EXTERNAL EAR SURGERY    . TONSILLECTOMY       OB History   No obstetric history on file.     Family History  Problem Relation Age of Onset  . Heart attack Mother   . Stroke Father   . Hypertension Brother     Social History   Tobacco Use  . Smoking status: Former Smoker  Substance Use Topics  . Alcohol use: Yes  . Drug use: No    Home Medications Prior to Admission medications   Medication Sig Start Date End Date Taking? Authorizing Provider  dorzolamide-timolol (COSOPT) 22.3-6.8 MG/ML ophthalmic solution Place 1 drop into both eyes 2 (two)  times daily.    [provider]  HYDROcodone-acetaminophen (NORCO/VICODIN) 5-325 MG tablet Take 1 tablet by mouth every 6 (six) hours as needed for severe pain. 11/29/19   Teondre Jarosz, PA-C  latanoprost (XALATAN) 0.005 % ophthalmic solution 1 drop at bedtime.    [provider]    Allergies    Patient has no known allergies.  Review of Systems   Review of Systems  Cardiovascular: Positive for chest pain (R sided chest pain).    Physical Exam Updated Vital Signs BP (!) 195/72 (BP Location: Right Arm)   Pulse 67   Temp 98.1 F (36.7 C) (Oral)   Resp 16   SpO2 100%   Physical Exam Vitals and nursing note reviewed.  Constitutional:      General: She is not in acute distress.    Appearance: She is well-developed.     Comments: Resting in the bed in no acute distress  HENT:     Head: Normocephalic and atraumatic.  Eyes:     Conjunctiva/sclera: Conjunctivae normal.     Pupils: Pupils are equal, round, and reactive to light.  Cardiovascular:     Rate and Rhythm: Normal rate and regular rhythm.     Pulses: Normal pulses.  Pulmonary:     Effort: Pulmonary effort is normal. No respiratory distress.     Breath sounds: Normal breath  sounds. No wheezing.     Comments: Tenderness palpation of right lateral posterior ribs.  No obvious deformity.  Speaking full sentences.  Clear lungs in all fields.  Sats stable on room air. Chest:     Chest wall: Tenderness present.  Abdominal:     General: There is no distension.     Palpations: Abdomen is soft.     Tenderness: There is no abdominal tenderness. There is no guarding or rebound.  Musculoskeletal:        General: Normal range of motion.     Cervical back: Normal range of motion and neck supple.  Skin:    General: Skin is warm and dry.     Capillary Refill: Capillary refill takes less than 2 seconds.  Neurological:     Mental Status: She is alert and oriented to person, place, and time.     ED Results /  Procedures / Treatments   Labs (all labs ordered are listed, but only abnormal results are displayed) Labs Reviewed  CBC - Abnormal; Notable for the following components:      Result Value   Hemoglobin 15.4 (*)    HCT 46.6 (*)    MCV 102.9 (*)    All other components within normal limits  BASIC METABOLIC PANEL - Abnormal; Notable for the following components:   Glucose, Bld 110 (*)    All other components within normal limits    EKG None  Radiology No results found.  Procedures Procedures (including critical care time)  Medications Ordered in ED Medications  HYDROcodone-acetaminophen (NORCO/VICODIN) 5-325 MG per tablet 1 tablet (1 tablet Oral Given 11/29/19 1106)    ED Course  I have reviewed the triage vital signs and the nursing notes.  Pertinent labs & imaging results that were available during my care of the patient were reviewed by me and considered in my medical decision making (see chart for details).    MDM Rules/Calculators/A&P                          Patient presenting for evaluation of right-sided rib pain after fall several days ago.  On exam, patient is nontoxic.  Pulmonary exam is overall reassuring, no signs of respiratory distress.  Outside urgent care showed multiple fractures of right side (7-9). I personally reviewed images.  As patient has improving symptoms, I do not believe she would benefit from hospitalization for pain control.  She signs of fever, or infection, do not believe she needs repeat x-ray.  Discussed pain control with Norco, Tylenol, ibuprofen.  Encouraged patient to take deep breaths prevent infection.  Case discussed with attending, Dr. Eulis Foster evaluated patient.  At this time, patient appears safe for discharge.  Precautions given.  Patient states she understands and agrees to plan.  Final Clinical Impression(s) / ED Diagnoses Final diagnoses:  Closed fracture of multiple ribs of right side, initial encounter  Fall, initial encounter     Rx / DC Orders ED Discharge Orders         Ordered    HYDROcodone-acetaminophen (NORCO/VICODIN) 5-325 MG tablet  Every 6 hours PRN,   Status:  Discontinued        11/29/19 1145    HYDROcodone-acetaminophen (NORCO/VICODIN) 5-325 MG tablet  Every 6 hours PRN        11/29/19 1145           Jayquon Theiler, PA-C 11/29/19 1246    Daleen Bo, MD 11/29/19 1416

## 2019-11-29 NOTE — Discharge Instructions (Signed)
Continue taking tylenol and ibuprofen as needed for mild to moderate pain.  Use norco as needed for severe or breakthrough pain. Have caution, this amy make you tired or groggy. Do not drive or operate heavy machinery while taking this medicine.  Make sure you are taking at least 10 deep breaths in and out each hour to reduce your risk of infection.  Follow up with your primary care doctor as needed for further evaluation.  Return to the ER if you develop fever, cough, increased difficulty breathing, severe worsening pain, or any new, worsening, or concerning symptoms.

## 2019-11-29 NOTE — ED Triage Notes (Signed)
Pt arrived POV, s/p mechanical fall thurs night, c/o right rib pain, was taking ibuprofen with little relief, went to urgent care this morning, and told she had x4 broken ribs on the right side. Denies any SOB.

## 2019-11-29 NOTE — ED Provider Notes (Signed)
  Face-to-face evaluation   History: She fell and injured her right ribs, 4 days ago.  She has been doing well, walking, eating and being careful with movement.  She had imaging today which showed multiple rib fractures, right side.  No other complications on the imaging.  This was done at a local urgent care.  She came here because she was advised that she might need surgery.  Physical exam: Alert elderly female, in mild distress.  She is splinting the right chest wall, because of the pain.  There is no respiratory distress.  Medical screening examination/treatment/procedure(s) were conducted as a shared visit with non-physician practitioner(s) and myself.  I personally evaluated the patient during the encounter    Daleen Bo, MD 11/29/19 [phone]

## 2019-12-01 DIAGNOSIS — H2512 Age-related nuclear cataract, left eye: Secondary | ICD-10-CM | POA: Diagnosis not present

## 2019-12-01 DIAGNOSIS — Z01818 Encounter for other preprocedural examination: Secondary | ICD-10-CM | POA: Diagnosis not present

## 2019-12-01 DIAGNOSIS — H401123 Primary open-angle glaucoma, left eye, severe stage: Secondary | ICD-10-CM | POA: Diagnosis not present

## 2019-12-14 DIAGNOSIS — E785 Hyperlipidemia, unspecified: Secondary | ICD-10-CM | POA: Diagnosis not present

## 2019-12-14 DIAGNOSIS — I10 Essential (primary) hypertension: Secondary | ICD-10-CM | POA: Diagnosis not present

## 2019-12-15 DIAGNOSIS — H25812 Combined forms of age-related cataract, left eye: Secondary | ICD-10-CM | POA: Diagnosis not present

## 2019-12-15 DIAGNOSIS — I10 Essential (primary) hypertension: Secondary | ICD-10-CM | POA: Diagnosis not present

## 2019-12-15 DIAGNOSIS — H401123 Primary open-angle glaucoma, left eye, severe stage: Secondary | ICD-10-CM | POA: Diagnosis not present

## 2019-12-16 DIAGNOSIS — H401111 Primary open-angle glaucoma, right eye, mild stage: Secondary | ICD-10-CM | POA: Diagnosis not present

## 2019-12-16 DIAGNOSIS — H5010 Unspecified exotropia: Secondary | ICD-10-CM | POA: Diagnosis not present

## 2019-12-16 DIAGNOSIS — Z9889 Other specified postprocedural states: Secondary | ICD-10-CM | POA: Diagnosis not present

## 2019-12-16 DIAGNOSIS — H401123 Primary open-angle glaucoma, left eye, severe stage: Secondary | ICD-10-CM | POA: Diagnosis not present

## 2019-12-16 DIAGNOSIS — Z48810 Encounter for surgical aftercare following surgery on the sense organs: Secondary | ICD-10-CM | POA: Diagnosis not present

## 2019-12-16 DIAGNOSIS — Z961 Presence of intraocular lens: Secondary | ICD-10-CM | POA: Diagnosis not present

## 2020-01-18 DIAGNOSIS — H401123 Primary open-angle glaucoma, left eye, severe stage: Secondary | ICD-10-CM | POA: Diagnosis not present

## 2020-01-18 DIAGNOSIS — H5010 Unspecified exotropia: Secondary | ICD-10-CM | POA: Diagnosis not present

## 2020-01-18 DIAGNOSIS — H401111 Primary open-angle glaucoma, right eye, mild stage: Secondary | ICD-10-CM | POA: Diagnosis not present

## 2020-01-19 DIAGNOSIS — I10 Essential (primary) hypertension: Secondary | ICD-10-CM | POA: Diagnosis not present

## 2020-02-23 DIAGNOSIS — Z48810 Encounter for surgical aftercare following surgery on the sense organs: Secondary | ICD-10-CM | POA: Diagnosis not present

## 2020-02-23 DIAGNOSIS — H401123 Primary open-angle glaucoma, left eye, severe stage: Secondary | ICD-10-CM | POA: Diagnosis not present

## 2020-02-23 DIAGNOSIS — Z961 Presence of intraocular lens: Secondary | ICD-10-CM | POA: Diagnosis not present

## 2020-02-23 DIAGNOSIS — Z9842 Cataract extraction status, left eye: Secondary | ICD-10-CM | POA: Diagnosis not present

## 2020-02-23 DIAGNOSIS — H5010 Unspecified exotropia: Secondary | ICD-10-CM | POA: Diagnosis not present

## 2020-02-23 DIAGNOSIS — H2511 Age-related nuclear cataract, right eye: Secondary | ICD-10-CM | POA: Diagnosis not present

## 2020-02-23 DIAGNOSIS — H34812 Central retinal vein occlusion, left eye, with macular edema: Secondary | ICD-10-CM | POA: Diagnosis not present

## 2020-02-23 DIAGNOSIS — H401111 Primary open-angle glaucoma, right eye, mild stage: Secondary | ICD-10-CM | POA: Diagnosis not present

## 2020-02-23 DIAGNOSIS — Z9889 Other specified postprocedural states: Secondary | ICD-10-CM | POA: Diagnosis not present

## 2020-03-21 DIAGNOSIS — H401123 Primary open-angle glaucoma, left eye, severe stage: Secondary | ICD-10-CM | POA: Diagnosis not present

## 2020-03-21 DIAGNOSIS — H5010 Unspecified exotropia: Secondary | ICD-10-CM | POA: Diagnosis not present

## 2020-03-21 DIAGNOSIS — H401111 Primary open-angle glaucoma, right eye, mild stage: Secondary | ICD-10-CM | POA: Diagnosis not present

## 2020-03-24 DIAGNOSIS — R7989 Other specified abnormal findings of blood chemistry: Secondary | ICD-10-CM | POA: Diagnosis not present

## 2020-03-24 DIAGNOSIS — M810 Age-related osteoporosis without current pathological fracture: Secondary | ICD-10-CM | POA: Diagnosis not present

## 2020-03-24 DIAGNOSIS — I10 Essential (primary) hypertension: Secondary | ICD-10-CM | POA: Diagnosis not present

## 2020-03-31 DIAGNOSIS — H401123 Primary open-angle glaucoma, left eye, severe stage: Secondary | ICD-10-CM | POA: Diagnosis not present

## 2020-03-31 DIAGNOSIS — H5010 Unspecified exotropia: Secondary | ICD-10-CM | POA: Diagnosis not present

## 2020-03-31 DIAGNOSIS — H401111 Primary open-angle glaucoma, right eye, mild stage: Secondary | ICD-10-CM | POA: Diagnosis not present

## 2020-04-18 DIAGNOSIS — I10 Essential (primary) hypertension: Secondary | ICD-10-CM | POA: Diagnosis not present

## 2020-04-18 DIAGNOSIS — H259 Unspecified age-related cataract: Secondary | ICD-10-CM | POA: Diagnosis not present

## 2020-04-18 DIAGNOSIS — Z0100 Encounter for examination of eyes and vision without abnormal findings: Secondary | ICD-10-CM | POA: Diagnosis not present

## 2020-04-18 DIAGNOSIS — H40009 Preglaucoma, unspecified, unspecified eye: Secondary | ICD-10-CM | POA: Diagnosis not present

## 2020-04-18 DIAGNOSIS — Z135 Encounter for screening for eye and ear disorders: Secondary | ICD-10-CM | POA: Diagnosis not present

## 2020-04-20 DIAGNOSIS — H52223 Regular astigmatism, bilateral: Secondary | ICD-10-CM | POA: Diagnosis not present

## 2020-04-20 DIAGNOSIS — H524 Presbyopia: Secondary | ICD-10-CM | POA: Diagnosis not present

## 2020-05-13 DIAGNOSIS — H53002 Unspecified amblyopia, left eye: Secondary | ICD-10-CM | POA: Diagnosis not present

## 2020-05-13 DIAGNOSIS — H401133 Primary open-angle glaucoma, bilateral, severe stage: Secondary | ICD-10-CM | POA: Diagnosis not present

## 2020-05-13 DIAGNOSIS — Z9889 Other specified postprocedural states: Secondary | ICD-10-CM | POA: Diagnosis not present

## 2020-05-13 DIAGNOSIS — H348122 Central retinal vein occlusion, left eye, stable: Secondary | ICD-10-CM | POA: Diagnosis not present

## 2020-05-13 DIAGNOSIS — H5010 Unspecified exotropia: Secondary | ICD-10-CM | POA: Diagnosis not present

## 2020-05-13 DIAGNOSIS — H26492 Other secondary cataract, left eye: Secondary | ICD-10-CM | POA: Diagnosis not present

## 2020-05-13 DIAGNOSIS — Z9842 Cataract extraction status, left eye: Secondary | ICD-10-CM | POA: Diagnosis not present

## 2020-05-13 DIAGNOSIS — Z961 Presence of intraocular lens: Secondary | ICD-10-CM | POA: Diagnosis not present

## 2020-05-31 DIAGNOSIS — H401111 Primary open-angle glaucoma, right eye, mild stage: Secondary | ICD-10-CM | POA: Diagnosis not present

## 2020-05-31 DIAGNOSIS — H401123 Primary open-angle glaucoma, left eye, severe stage: Secondary | ICD-10-CM | POA: Diagnosis not present

## 2020-06-09 DIAGNOSIS — H401123 Primary open-angle glaucoma, left eye, severe stage: Secondary | ICD-10-CM | POA: Diagnosis not present

## 2020-06-09 DIAGNOSIS — H2511 Age-related nuclear cataract, right eye: Secondary | ICD-10-CM | POA: Diagnosis not present

## 2020-06-09 DIAGNOSIS — H34812 Central retinal vein occlusion, left eye, with macular edema: Secondary | ICD-10-CM | POA: Diagnosis not present

## 2020-06-09 DIAGNOSIS — Z961 Presence of intraocular lens: Secondary | ICD-10-CM | POA: Diagnosis not present

## 2020-06-09 DIAGNOSIS — H401111 Primary open-angle glaucoma, right eye, mild stage: Secondary | ICD-10-CM | POA: Diagnosis not present

## 2020-06-09 DIAGNOSIS — H5010 Unspecified exotropia: Secondary | ICD-10-CM | POA: Diagnosis not present

## 2020-07-27 DIAGNOSIS — H401111 Primary open-angle glaucoma, right eye, mild stage: Secondary | ICD-10-CM | POA: Diagnosis not present

## 2020-07-27 DIAGNOSIS — H401123 Primary open-angle glaucoma, left eye, severe stage: Secondary | ICD-10-CM | POA: Diagnosis not present

## 2020-09-15 DIAGNOSIS — Z1231 Encounter for screening mammogram for malignant neoplasm of breast: Secondary | ICD-10-CM | POA: Diagnosis not present

## 2020-10-11 DIAGNOSIS — M810 Age-related osteoporosis without current pathological fracture: Secondary | ICD-10-CM | POA: Diagnosis not present

## 2020-11-21 DIAGNOSIS — H401123 Primary open-angle glaucoma, left eye, severe stage: Secondary | ICD-10-CM | POA: Diagnosis not present

## 2020-11-21 DIAGNOSIS — H401111 Primary open-angle glaucoma, right eye, mild stage: Secondary | ICD-10-CM | POA: Diagnosis not present

## 2020-11-22 DIAGNOSIS — Z23 Encounter for immunization: Secondary | ICD-10-CM | POA: Diagnosis not present

## 2020-11-22 DIAGNOSIS — M810 Age-related osteoporosis without current pathological fracture: Secondary | ICD-10-CM | POA: Diagnosis not present

## 2020-11-22 DIAGNOSIS — Z0000 Encounter for general adult medical examination without abnormal findings: Secondary | ICD-10-CM | POA: Diagnosis not present

## 2020-11-22 DIAGNOSIS — Z1389 Encounter for screening for other disorder: Secondary | ICD-10-CM | POA: Diagnosis not present

## 2020-11-22 DIAGNOSIS — I10 Essential (primary) hypertension: Secondary | ICD-10-CM | POA: Diagnosis not present

## 2020-11-22 DIAGNOSIS — E785 Hyperlipidemia, unspecified: Secondary | ICD-10-CM | POA: Diagnosis not present

## 2020-11-28 DIAGNOSIS — M8589 Other specified disorders of bone density and structure, multiple sites: Secondary | ICD-10-CM | POA: Diagnosis not present

## 2020-12-20 DIAGNOSIS — I10 Essential (primary) hypertension: Secondary | ICD-10-CM | POA: Diagnosis not present

## 2021-03-09 DIAGNOSIS — H2512 Age-related nuclear cataract, left eye: Secondary | ICD-10-CM | POA: Diagnosis not present

## 2021-03-09 DIAGNOSIS — Z9842 Cataract extraction status, left eye: Secondary | ICD-10-CM | POA: Diagnosis not present

## 2021-03-09 DIAGNOSIS — H34812 Central retinal vein occlusion, left eye, with macular edema: Secondary | ICD-10-CM | POA: Diagnosis not present

## 2021-03-09 DIAGNOSIS — H50112 Monocular exotropia, left eye: Secondary | ICD-10-CM | POA: Diagnosis not present

## 2021-03-09 DIAGNOSIS — H35342 Macular cyst, hole, or pseudohole, left eye: Secondary | ICD-10-CM | POA: Diagnosis not present

## 2021-03-09 DIAGNOSIS — H53002 Unspecified amblyopia, left eye: Secondary | ICD-10-CM | POA: Diagnosis not present

## 2021-03-09 DIAGNOSIS — Z961 Presence of intraocular lens: Secondary | ICD-10-CM | POA: Diagnosis not present

## 2021-03-27 DIAGNOSIS — H401111 Primary open-angle glaucoma, right eye, mild stage: Secondary | ICD-10-CM | POA: Diagnosis not present

## 2021-03-27 DIAGNOSIS — H401123 Primary open-angle glaucoma, left eye, severe stage: Secondary | ICD-10-CM | POA: Diagnosis not present

## 2021-04-08 ENCOUNTER — Observation Stay (HOSPITAL_COMMUNITY): Payer: Medicare HMO

## 2021-04-08 ENCOUNTER — Encounter (HOSPITAL_COMMUNITY): Payer: Self-pay | Admitting: Family Medicine

## 2021-04-08 ENCOUNTER — Inpatient Hospital Stay (HOSPITAL_COMMUNITY)
Admission: EM | Admit: 2021-04-08 | Discharge: 2021-04-11 | DRG: 023 | Disposition: A | Discharge status: Home / Self Care | Payer: Medicare HMO | Attending: Neurology | Admitting: Neurology

## 2021-04-08 ENCOUNTER — Inpatient Hospital Stay (HOSPITAL_COMMUNITY): Payer: Medicare HMO

## 2021-04-08 ENCOUNTER — Observation Stay (HOSPITAL_COMMUNITY): Payer: Medicare HMO | Admitting: Registered Nurse

## 2021-04-08 ENCOUNTER — Emergency Department (HOSPITAL_COMMUNITY): Payer: Medicare HMO

## 2021-04-08 ENCOUNTER — Encounter (HOSPITAL_COMMUNITY)
Admission: EM | Disposition: A | Discharge status: Home / Self Care | Payer: Self-pay | Source: Home / Self Care | Attending: Neurology

## 2021-04-08 ENCOUNTER — Other Ambulatory Visit: Payer: Self-pay

## 2021-04-08 DIAGNOSIS — I609 Nontraumatic subarachnoid hemorrhage, unspecified: Secondary | ICD-10-CM | POA: Diagnosis not present

## 2021-04-08 DIAGNOSIS — I6602 Occlusion and stenosis of left middle cerebral artery: Secondary | ICD-10-CM | POA: Diagnosis not present

## 2021-04-08 DIAGNOSIS — R531 Weakness: Secondary | ICD-10-CM

## 2021-04-08 DIAGNOSIS — R4701 Aphasia: Secondary | ICD-10-CM | POA: Diagnosis present

## 2021-04-08 DIAGNOSIS — H409 Unspecified glaucoma: Secondary | ICD-10-CM | POA: Diagnosis present

## 2021-04-08 DIAGNOSIS — Z20822 Contact with and (suspected) exposure to covid-19: Secondary | ICD-10-CM | POA: Diagnosis not present

## 2021-04-08 DIAGNOSIS — E876 Hypokalemia: Secondary | ICD-10-CM | POA: Diagnosis not present

## 2021-04-08 DIAGNOSIS — I6340 Cerebral infarction due to embolism of unspecified cerebral artery: Secondary | ICD-10-CM | POA: Insufficient documentation

## 2021-04-08 DIAGNOSIS — R2990 Unspecified symptoms and signs involving the nervous system: Secondary | ICD-10-CM | POA: Diagnosis present

## 2021-04-08 DIAGNOSIS — I63512 Cerebral infarction due to unspecified occlusion or stenosis of left middle cerebral artery: Secondary | ICD-10-CM

## 2021-04-08 DIAGNOSIS — G8191 Hemiplegia, unspecified affecting right dominant side: Secondary | ICD-10-CM | POA: Diagnosis not present

## 2021-04-08 DIAGNOSIS — I2510 Atherosclerotic heart disease of native coronary artery without angina pectoris: Secondary | ICD-10-CM | POA: Diagnosis present

## 2021-04-08 DIAGNOSIS — I63412 Cerebral infarction due to embolism of left middle cerebral artery: Secondary | ICD-10-CM | POA: Diagnosis not present

## 2021-04-08 DIAGNOSIS — Z8673 Personal history of transient ischemic attack (TIA), and cerebral infarction without residual deficits: Secondary | ICD-10-CM

## 2021-04-08 DIAGNOSIS — H548 Legal blindness, as defined in USA: Secondary | ICD-10-CM | POA: Diagnosis present

## 2021-04-08 DIAGNOSIS — R29713 NIHSS score 13: Secondary | ICD-10-CM | POA: Diagnosis not present

## 2021-04-08 DIAGNOSIS — I6522 Occlusion and stenosis of left carotid artery: Secondary | ICD-10-CM | POA: Diagnosis not present

## 2021-04-08 DIAGNOSIS — R29700 NIHSS score 0: Secondary | ICD-10-CM | POA: Diagnosis present

## 2021-04-08 DIAGNOSIS — Z716 Tobacco abuse counseling: Secondary | ICD-10-CM

## 2021-04-08 DIAGNOSIS — Z87891 Personal history of nicotine dependence: Secondary | ICD-10-CM | POA: Diagnosis not present

## 2021-04-08 DIAGNOSIS — R29810 Facial weakness: Secondary | ICD-10-CM | POA: Diagnosis not present

## 2021-04-08 DIAGNOSIS — I639 Cerebral infarction, unspecified: Secondary | ICD-10-CM | POA: Diagnosis not present

## 2021-04-08 DIAGNOSIS — Z978 Presence of other specified devices: Secondary | ICD-10-CM

## 2021-04-08 DIAGNOSIS — E785 Hyperlipidemia, unspecified: Secondary | ICD-10-CM | POA: Diagnosis present

## 2021-04-08 DIAGNOSIS — J9601 Acute respiratory failure with hypoxia: Secondary | ICD-10-CM | POA: Diagnosis not present

## 2021-04-08 DIAGNOSIS — I10 Essential (primary) hypertension: Secondary | ICD-10-CM

## 2021-04-08 DIAGNOSIS — Z79899 Other long term (current) drug therapy: Secondary | ICD-10-CM | POA: Diagnosis not present

## 2021-04-08 DIAGNOSIS — Z823 Family history of stroke: Secondary | ICD-10-CM | POA: Diagnosis not present

## 2021-04-08 DIAGNOSIS — Z888 Allergy status to other drugs, medicaments and biological substances status: Secondary | ICD-10-CM

## 2021-04-08 DIAGNOSIS — I6523 Occlusion and stenosis of bilateral carotid arteries: Secondary | ICD-10-CM | POA: Diagnosis not present

## 2021-04-08 DIAGNOSIS — R471 Dysarthria and anarthria: Secondary | ICD-10-CM | POA: Diagnosis not present

## 2021-04-08 DIAGNOSIS — I63232 Cerebral infarction due to unspecified occlusion or stenosis of left carotid arteries: Secondary | ICD-10-CM

## 2021-04-08 DIAGNOSIS — R4781 Slurred speech: Secondary | ICD-10-CM | POA: Diagnosis not present

## 2021-04-08 DIAGNOSIS — Z4682 Encounter for fitting and adjustment of non-vascular catheter: Secondary | ICD-10-CM | POA: Diagnosis not present

## 2021-04-08 DIAGNOSIS — E7800 Pure hypercholesterolemia, unspecified: Secondary | ICD-10-CM | POA: Diagnosis not present

## 2021-04-08 DIAGNOSIS — Z9889 Other specified postprocedural states: Secondary | ICD-10-CM | POA: Diagnosis not present

## 2021-04-08 HISTORY — PX: RADIOLOGY WITH ANESTHESIA: SHX6223

## 2021-04-08 HISTORY — PX: IR PERCUTANEOUS ART THROMBECTOMY/INFUSION INTRACRANIAL INC DIAG ANGIO: IMG6087

## 2021-04-08 HISTORY — PX: IR ANGIO INTRA EXTRACRAN SEL COM CAROTID INNOMINATE UNI R MOD SED: IMG5359

## 2021-04-08 LAB — I-STAT CHEM 8, ED
BUN: 14 mg/dL (ref 8–23)
Calcium, Ion: 1.14 mmol/L — ABNORMAL LOW (ref 1.15–1.40)
Chloride: 100 mmol/L (ref 98–111)
Creatinine, Ser: 0.7 mg/dL (ref 0.44–1.00)
Glucose, Bld: 105 mg/dL — ABNORMAL HIGH (ref 70–99)
HCT: 49 % — ABNORMAL HIGH (ref 36.0–46.0)
Hemoglobin: 16.7 g/dL — ABNORMAL HIGH (ref 12.0–15.0)
Potassium: 4.3 mmol/L (ref 3.5–5.1)
Sodium: 133 mmol/L — ABNORMAL LOW (ref 135–145)
TCO2: 23 mmol/L (ref 22–32)

## 2021-04-08 LAB — CBG MONITORING, ED
Glucose-Capillary: 97 mg/dL (ref 70–99)
Glucose-Capillary: 103 mg/dL — ABNORMAL HIGH (ref 70–99)

## 2021-04-08 LAB — CBC
HCT: 45.8 % (ref 36.0–46.0)
HCT: 43 % (ref 36.0–46.0)
Hemoglobin: 15.9 g/dL — ABNORMAL HIGH (ref 12.0–15.0)
Hemoglobin: 15 g/dL (ref 12.0–15.0)
MCH: 34.3 pg — ABNORMAL HIGH (ref 26.0–34.0)
MCH: 34.3 pg — ABNORMAL HIGH (ref 26.0–34.0)
MCHC: 34.7 g/dL (ref 30.0–36.0)
MCHC: 34.9 g/dL (ref 30.0–36.0)
MCV: 98.7 fL (ref 80.0–100.0)
MCV: 98.4 fL (ref 80.0–100.0)
Platelets: 317 10*3/uL (ref 150–400)
Platelets: 326 10*3/uL (ref 150–400)
RBC: 4.64 MIL/uL (ref 3.87–5.11)
RBC: 4.37 MIL/uL (ref 3.87–5.11)
RDW: 12.3 % (ref 11.5–15.5)
RDW: 12.3 % (ref 11.5–15.5)
WBC: 8.2 10*3/uL (ref 4.0–10.5)
WBC: 10.4 10*3/uL (ref 4.0–10.5)
nRBC: 0 % (ref 0.0–0.2)
nRBC: 0 % (ref 0.0–0.2)

## 2021-04-08 LAB — DIFFERENTIAL
Abs Immature Granulocytes: 0.02 10*3/uL (ref 0.00–0.07)
Basophils Absolute: 0 10*3/uL (ref 0.0–0.1)
Basophils Relative: 1 %
Eosinophils Absolute: 0 10*3/uL (ref 0.0–0.5)
Eosinophils Relative: 1 %
Immature Granulocytes: 0 %
Lymphocytes Relative: 26 %
Lymphs Abs: 2.1 10*3/uL (ref 0.7–4.0)
Monocytes Absolute: 0.5 10*3/uL (ref 0.1–1.0)
Monocytes Relative: 6 %
Neutro Abs: 5.5 10*3/uL (ref 1.7–7.7)
Neutrophils Relative %: 66 %

## 2021-04-08 LAB — PROTIME-INR
INR: 1 (ref 0.8–1.2)
Prothrombin Time: 12.8 seconds (ref 11.4–15.2)

## 2021-04-08 LAB — COMPREHENSIVE METABOLIC PANEL
ALT: 16 U/L (ref 0–44)
AST: 22 U/L (ref 15–41)
Albumin: 4.4 g/dL (ref 3.5–5.0)
Alkaline Phosphatase: 54 U/L (ref 38–126)
Anion gap: 15 (ref 5–15)
BUN: 12 mg/dL (ref 8–23)
CO2: 22 mmol/L (ref 22–32)
Calcium: 10.3 mg/dL (ref 8.9–10.3)
Chloride: 97 mmol/L — ABNORMAL LOW (ref 98–111)
Creatinine, Ser: 0.73 mg/dL (ref 0.44–1.00)
GFR, Estimated: >60 mL/min (ref >60)
Glucose, Bld: 110 mg/dL — ABNORMAL HIGH (ref 70–99)
Potassium: 4.3 mmol/L (ref 3.5–5.1)
Sodium: 134 mmol/L — ABNORMAL LOW (ref 135–145)
Total Bilirubin: 0.6 mg/dL (ref 0.3–1.2)
Total Protein: 7.5 g/dL (ref 6.5–8.1)

## 2021-04-08 LAB — RESP PANEL BY RT-PCR (FLU A&B, COVID) ARPGX2
Influenza A by PCR: NEGATIVE
Influenza B by PCR: NEGATIVE
SARS Coronavirus 2 by RT PCR: NEGATIVE

## 2021-04-08 LAB — POCT I-STAT 7, (LYTES, BLD GAS, ICA,H+H)
Acid-base deficit: 2 mmol/L (ref 0.0–2.0)
Bicarbonate: 22.9 mmol/L (ref 20.0–28.0)
Calcium, Ion: 1.12 mmol/L — ABNORMAL LOW (ref 1.15–1.40)
HCT: 41 % (ref 36.0–46.0)
Hemoglobin: 13.9 g/dL (ref 12.0–15.0)
O2 Saturation: 100 %
Patient temperature: 98.6
Potassium: 3 mmol/L — ABNORMAL LOW (ref 3.5–5.1)
Sodium: 135 mmol/L (ref 135–145)
TCO2: 24 mmol/L (ref 22–32)
pCO2 arterial: 39.3 mmHg (ref 32–48)
pH, Arterial: 7.373 (ref 7.35–7.45)
pO2, Arterial: 418 mmHg — ABNORMAL HIGH (ref 83–108)

## 2021-04-08 LAB — ECHOCARDIOGRAM COMPLETE
AR max vel: 1.76 cm2
AV Area VTI: 1.78 cm2
AV Area mean vel: 1.73 cm2
AV Mean grad: 3 mmHg
AV Peak grad: 6.2 mmHg
Ao pk vel: 1.24 m/s
Area-P 1/2: 3.43 cm2
S' Lateral: 2.7 cm
Weight: 1940.05 oz

## 2021-04-08 LAB — APTT: aPTT: 26 seconds (ref 24–36)

## 2021-04-08 LAB — CREATININE, SERUM
Creatinine, Ser: 0.51 mg/dL (ref 0.44–1.00)
GFR, Estimated: >60 mL/min (ref >60)

## 2021-04-08 SURGERY — IR WITH ANESTHESIA
Anesthesia: General

## 2021-04-08 MED ORDER — STROKE: EARLY STAGES OF RECOVERY BOOK: Freq: Once | Status: DC

## 2021-04-08 MED ORDER — CLEVIDIPINE BUTYRATE 0.5 MG/ML IV EMUL
INTRAVENOUS | Status: DC | PRN
  Administered 2021-04-08: 2 mg/h via INTRAVENOUS

## 2021-04-08 MED ORDER — TICAGRELOR 90 MG PO TABS
ORAL_TABLET | ORAL | Status: AC
  Filled 2021-04-08: qty 2

## 2021-04-08 MED ORDER — ACETAMINOPHEN 325 MG PO TABS
650.0000 mg | ORAL_TABLET | ORAL | Status: DC | PRN
Start: 2021-04-08 — End: 2021-04-10

## 2021-04-08 MED ORDER — ROCURONIUM BROMIDE 10 MG/ML (PF) SYRINGE
PREFILLED_SYRINGE | INTRAVENOUS | Status: DC | PRN
  Administered 2021-04-08 (×2): 50 mg via INTRAVENOUS

## 2021-04-08 MED ORDER — CLOPIDOGREL BISULFATE 75 MG PO TABS
75.0000 mg | ORAL_TABLET | Freq: Every day | ORAL | Status: DC
Start: 2021-04-09 — End: 2021-04-08

## 2021-04-08 MED ORDER — CEFAZOLIN SODIUM-DEXTROSE 2-3 GM-%(50ML) IV SOLR
INTRAVENOUS | Status: DC | PRN
  Administered 2021-04-08: 2 g via INTRAVENOUS

## 2021-04-08 MED ORDER — ACETAMINOPHEN 160 MG/5ML PO SOLN: 650.0000 mg | ORAL | Status: DC | PRN

## 2021-04-08 MED ORDER — PROPOFOL 1000 MG/100ML IV EMUL
0.0000 ug/kg/min | INTRAVENOUS | Status: DC
  Administered 2021-04-09: 45 ug/kg/min via INTRAVENOUS
  Administered 2021-04-09: 40 ug/kg/min via INTRAVENOUS
  Filled 2021-04-08: qty 100

## 2021-04-08 MED ORDER — ENOXAPARIN SODIUM 40 MG/0.4ML IJ SOSY
40.0000 mg | PREFILLED_SYRINGE | Freq: Every day | INTRAMUSCULAR | Status: DC
  Administered 2021-04-09 – 2021-04-10 (×2): 40 mg via SUBCUTANEOUS
  Filled 2021-04-08 (×2): qty 0.4

## 2021-04-08 MED ORDER — LIDOCAINE 2% (20 MG/ML) 5 ML SYRINGE
INTRAMUSCULAR | Status: DC | PRN
  Administered 2021-04-08: 60 mg via INTRAVENOUS

## 2021-04-08 MED ORDER — LATANOPROST 0.005 % OP SOLN
1.0000 [drp] | Freq: Every day | OPHTHALMIC | Status: DC
  Administered 2021-04-09 – 2021-04-10 (×3): 1 [drp] via OPHTHALMIC
  Filled 2021-04-08: qty 2.5

## 2021-04-08 MED ORDER — PHENYLEPHRINE 40 MCG/ML (10ML) SYRINGE FOR IV PUSH (FOR BLOOD PRESSURE SUPPORT)
PREFILLED_SYRINGE | INTRAVENOUS | Status: DC | PRN
  Administered 2021-04-08 (×3): 40 ug via INTRAVENOUS

## 2021-04-08 MED ORDER — PANTOPRAZOLE SODIUM 40 MG PO TBEC: 40.0000 mg | DELAYED_RELEASE_TABLET | Freq: Every day | ORAL | Status: DC

## 2021-04-08 MED ORDER — ACETAMINOPHEN 650 MG RE SUPP: 650.0000 mg | RECTAL | Status: DC | PRN

## 2021-04-08 MED ORDER — SODIUM CHLORIDE 0.9 % IV SOLN: INTRAVENOUS | Status: DC

## 2021-04-08 MED ORDER — SENNOSIDES-DOCUSATE SODIUM 8.6-50 MG PO TABS: 1.0000 | ORAL_TABLET | Freq: Every evening | ORAL | Status: DC | PRN

## 2021-04-08 MED ORDER — ASPIRIN 81 MG PO CHEW
CHEWABLE_TABLET | ORAL | Status: AC
  Filled 2021-04-08: qty 1

## 2021-04-08 MED ORDER — CHLORHEXIDINE GLUCONATE CLOTH 2 % EX PADS
6.0000 | MEDICATED_PAD | Freq: Every day | CUTANEOUS | Status: DC
  Administered 2021-04-08 – 2021-04-09 (×2): 6 via TOPICAL

## 2021-04-08 MED ORDER — IOHEXOL 300 MG/ML  SOLN
100.0000 mL | Freq: Once | INTRAMUSCULAR | Status: AC | PRN
  Administered 2021-04-08: 50 mL via INTRA_ARTERIAL

## 2021-04-08 MED ORDER — DOCUSATE SODIUM 50 MG/5ML PO LIQD
100.0000 mg | Freq: Two times a day (BID) | ORAL | Status: DC
  Administered 2021-04-09: 100 mg
  Filled 2021-04-08: qty 10

## 2021-04-08 MED ORDER — ATORVASTATIN CALCIUM 40 MG PO TABS
40.0000 mg | ORAL_TABLET | Freq: Every day | ORAL | Status: DC
Start: 2021-04-08 — End: 2021-04-08
  Administered 2021-04-08: 40 mg via ORAL
  Filled 2021-04-08: qty 1

## 2021-04-08 MED ORDER — ESMOLOL HCL 100 MG/10ML IV SOLN
INTRAVENOUS | Status: DC | PRN
  Administered 2021-04-08: 20 mg via INTRAVENOUS

## 2021-04-08 MED ORDER — SODIUM CHLORIDE 0.9 % IV BOLUS
500.0000 mL | Freq: Once | INTRAVENOUS | Status: AC
  Administered 2021-04-08: 500 mL via INTRAVENOUS

## 2021-04-08 MED ORDER — PHENYLEPHRINE HCL-NACL 20-0.9 MG/250ML-% IV SOLN
INTRAVENOUS | Status: DC | PRN
Start: 2021-04-08 — End: 2021-04-08
  Administered 2021-04-08: 25 ug/min via INTRAVENOUS

## 2021-04-08 MED ORDER — POLYETHYLENE GLYCOL 3350 17 G PO PACK
17.0000 g | PACK | Freq: Every day | ORAL | Status: DC
  Administered 2021-04-09: 17 g
  Filled 2021-04-08: qty 1

## 2021-04-08 MED ORDER — DORZOLAMIDE HCL-TIMOLOL MAL 2-0.5 % OP SOLN
1.0000 [drp] | Freq: Two times a day (BID) | OPHTHALMIC | Status: DC
  Administered 2021-04-09 – 2021-04-11 (×6): 1 [drp] via OPHTHALMIC
  Filled 2021-04-08: qty 10

## 2021-04-08 MED ORDER — ASPIRIN 81 MG PO CHEW
81.0000 mg | CHEWABLE_TABLET | Freq: Every day | ORAL | Status: DC
  Administered 2021-04-09: 81 mg
  Filled 2021-04-08: qty 1

## 2021-04-08 MED ORDER — STROKE: EARLY STAGES OF RECOVERY BOOK
Freq: Once | Status: AC
  Filled 2021-04-08: qty 1

## 2021-04-08 MED ORDER — IOHEXOL 350 MG/ML SOLN
100.0000 mL | Freq: Once | INTRAVENOUS | Status: AC | PRN
  Administered 2021-04-08: 100 mL via INTRAVENOUS

## 2021-04-08 MED ORDER — PROPOFOL 1000 MG/100ML IV EMUL
5.0000 ug/kg/min | INTRAVENOUS | Status: DC
Start: 2021-04-09 — End: 2021-04-08

## 2021-04-08 MED ORDER — ENOXAPARIN SODIUM 40 MG/0.4ML IJ SOSY
40.0000 mg | PREFILLED_SYRINGE | INTRAMUSCULAR | Status: DC
  Filled 2021-04-08: qty 0.4

## 2021-04-08 MED ORDER — ASPIRIN 81 MG PO CHEW
81.0000 mg | CHEWABLE_TABLET | Freq: Every day | ORAL | Status: DC
Start: 2021-04-09 — End: 2021-04-11
  Administered 2021-04-10 – 2021-04-11 (×2): 81 mg via ORAL
  Filled 2021-04-08 (×2): qty 1

## 2021-04-08 MED ORDER — NITROGLYCERIN 1 MG/10 ML FOR IR/CATH LAB
INTRA_ARTERIAL | Status: AC
  Administered 2021-04-08 (×2): 25 ug via INTRA_ARTERIAL
  Filled 2021-04-08: qty 10

## 2021-04-08 MED ORDER — CLOPIDOGREL BISULFATE 300 MG PO TABS
ORAL_TABLET | ORAL | Status: AC
  Filled 2021-04-08: qty 1

## 2021-04-08 MED ORDER — ONDANSETRON HCL 4 MG/2ML IJ SOLN
INTRAMUSCULAR | Status: DC | PRN
  Administered 2021-04-08: 4 mg via INTRAVENOUS

## 2021-04-08 MED ORDER — ACETAMINOPHEN 325 MG PO TABS: 650.0000 mg | ORAL_TABLET | ORAL | Status: DC | PRN

## 2021-04-08 MED ORDER — PROPOFOL 500 MG/50ML IV EMUL
INTRAVENOUS | Status: DC | PRN
  Administered 2021-04-08: 25 ug/kg/min via INTRAVENOUS

## 2021-04-08 MED ORDER — CLOPIDOGREL BISULFATE 300 MG PO TABS
300.0000 mg | ORAL_TABLET | Freq: Once | ORAL | Status: AC
  Administered 2021-04-08: 300 mg via ORAL
  Filled 2021-04-08: qty 1

## 2021-04-08 MED ORDER — SODIUM CHLORIDE 0.9 % IV SOLN: INTRAVENOUS | Status: DC | PRN

## 2021-04-08 MED ORDER — DORZOLAMIDE HCL-TIMOLOL MAL 2-0.5 % OP SOLN: 1.0000 [drp] | Freq: Two times a day (BID) | OPHTHALMIC | Status: DC

## 2021-04-08 MED ORDER — SODIUM CHLORIDE 0.9% FLUSH: 3.0000 mL | Freq: Once | INTRAVENOUS | Status: DC

## 2021-04-08 MED ORDER — FENTANYL CITRATE PF 50 MCG/ML IJ SOSY
25.0000 ug | PREFILLED_SYRINGE | INTRAMUSCULAR | Status: DC | PRN
  Administered 2021-04-09: 25 ug via INTRAVENOUS
  Filled 2021-04-08: qty 1

## 2021-04-08 MED ORDER — ASPIRIN EC 325 MG PO TBEC
325.0000 mg | DELAYED_RELEASE_TABLET | Freq: Every day | ORAL | Status: DC
  Administered 2021-04-08: 325 mg via ORAL
  Filled 2021-04-08: qty 1

## 2021-04-08 MED ORDER — ORAL CARE MOUTH RINSE
15.0000 mL | OROMUCOSAL | Status: DC
  Administered 2021-04-09 (×7): 15 mL via OROMUCOSAL

## 2021-04-08 MED ORDER — CEFAZOLIN SODIUM-DEXTROSE 2-4 GM/100ML-% IV SOLN
INTRAVENOUS | Status: AC
Start: 2021-04-08 — End: 2021-04-08
  Filled 2021-04-08: qty 100

## 2021-04-08 MED ORDER — EPTIFIBATIDE 20 MG/10ML IV SOLN
INTRAVENOUS | Status: AC
  Filled 2021-04-08: qty 10

## 2021-04-08 MED ORDER — IOHEXOL 300 MG/ML  SOLN
100.0000 mL | Freq: Once | INTRAMUSCULAR | Status: AC | PRN
  Administered 2021-04-08: 65 mL via INTRA_ARTERIAL

## 2021-04-08 MED ORDER — PROPOFOL 10 MG/ML IV BOLUS
INTRAVENOUS | Status: DC | PRN
  Administered 2021-04-08: 80 mg via INTRAVENOUS
  Administered 2021-04-08: 40 mg via INTRAVENOUS

## 2021-04-08 MED ORDER — FENTANYL CITRATE PF 50 MCG/ML IJ SOSY: 25.0000 ug | PREFILLED_SYRINGE | INTRAMUSCULAR | Status: DC | PRN

## 2021-04-08 MED ORDER — FENTANYL CITRATE (PF) 100 MCG/2ML IJ SOLN
INTRAMUSCULAR | Status: AC
  Filled 2021-04-08: qty 2

## 2021-04-08 MED ORDER — IOHEXOL 350 MG/ML SOLN
75.0000 mL | Freq: Once | INTRAVENOUS | Status: AC | PRN
  Administered 2021-04-08: 75 mL via INTRAVENOUS

## 2021-04-08 MED ORDER — LACTATED RINGERS IV SOLN: INTRAVENOUS | Status: DC | PRN

## 2021-04-08 MED ORDER — CLEVIDIPINE BUTYRATE 0.5 MG/ML IV EMUL
0.0000 mg/h | INTRAVENOUS | Status: AC
  Administered 2021-04-09: 8 mg/h via INTRAVENOUS
  Administered 2021-04-09: 4 mg/h via INTRAVENOUS
  Administered 2021-04-09: 8 mg/h via INTRAVENOUS
  Filled 2021-04-08 (×5): qty 50

## 2021-04-08 MED ORDER — CLOPIDOGREL BISULFATE 75 MG PO TABS
75.0000 mg | ORAL_TABLET | Freq: Every day | ORAL | Status: DC
  Administered 2021-04-10 – 2021-04-11 (×2): 75 mg via ORAL
  Filled 2021-04-08 (×2): qty 1

## 2021-04-08 MED ORDER — SUCCINYLCHOLINE CHLORIDE 200 MG/10ML IV SOSY
PREFILLED_SYRINGE | INTRAVENOUS | Status: DC | PRN
  Administered 2021-04-08: 80 mg via INTRAVENOUS

## 2021-04-08 MED ORDER — CLEVIDIPINE BUTYRATE 0.5 MG/ML IV EMUL
INTRAVENOUS | Status: AC
  Administered 2021-04-08: 6 mg/h via INTRAVENOUS
  Filled 2021-04-08: qty 50

## 2021-04-08 MED ORDER — LATANOPROST 0.005 % OP SOLN: 1.0000 [drp] | Freq: Every day | OPHTHALMIC | Status: DC

## 2021-04-08 MED ORDER — CANGRELOR TETRASODIUM 50 MG IV SOLR
INTRAVENOUS | Status: AC
  Filled 2021-04-08: qty 50

## 2021-04-08 MED ORDER — VERAPAMIL HCL 2.5 MG/ML IV SOLN
INTRAVENOUS | Status: AC
  Filled 2021-04-08: qty 2

## 2021-04-08 MED ORDER — CLOPIDOGREL BISULFATE 75 MG PO TABS
75.0000 mg | ORAL_TABLET | Freq: Every day | ORAL | Status: DC
  Administered 2021-04-09: 75 mg
  Filled 2021-04-08: qty 1

## 2021-04-08 MED ORDER — CHLORHEXIDINE GLUCONATE 0.12% ORAL RINSE (MEDLINE KIT)
15.0000 mL | Freq: Two times a day (BID) | OROMUCOSAL | Status: DC
  Administered 2021-04-08 – 2021-04-09 (×2): 15 mL via OROMUCOSAL

## 2021-04-08 MED ORDER — SODIUM CHLORIDE 0.9 % IV BOLUS
1000.0000 mL | Freq: Once | INTRAVENOUS | Status: AC
  Administered 2021-04-08: 1000 mL via INTRAVENOUS

## 2021-04-08 MED ORDER — FENTANYL CITRATE (PF) 250 MCG/5ML IJ SOLN
INTRAMUSCULAR | Status: DC | PRN
  Administered 2021-04-08: 100 ug via INTRAVENOUS

## 2021-04-08 NOTE — Anesthesia Preprocedure Evaluation (Addendum)
Anesthesia Evaluation  ?Patient identified by MRN, date of birth, ID band ?Patient confused ? ? ? ?Reviewed: ?Patient's Chart, lab work & pertinent test resultsPreop documentation limited or incomplete due to emergent nature of procedure. ? ?Airway ?Mallampati: II ? ?TM Distance: >3 FB ?Neck ROM: Full ? ? ? Dental ?no notable dental hx. ? ?  ?Pulmonary ?neg pulmonary ROS, former smoker,  ?  ?Pulmonary exam normal ? ? ? ? ? ? ? Cardiovascular ?hypertension, Pt. on medications ? ?Rhythm:Regular Rate:Normal ? ? ?  ?Neuro/Psych ?CODE STROKE ?CVA negative psych ROS  ? GI/Hepatic ?negative GI ROS, Neg liver ROS,   ?Endo/Other  ?negative endocrine ROS ? Renal/GU ?negative Renal ROS  ?negative genitourinary ?  ?Musculoskeletal ?negative musculoskeletal ROS ?(+)  ? Abdominal ?Normal abdominal exam  (+)   ?Peds ? Hematology ?negative hematology ROS ?(+)   ?Anesthesia Other Findings ? ? Reproductive/Obstetrics ? ?  ? ? ? ? ? ? ? ? ? ? ? ? ? ?  ?  ? ? ? ? ? ? ? ?Anesthesia Physical ?Anesthesia Plan ? ?ASA: 4 and emergent ? ?Anesthesia Plan: General  ? ?Post-op Pain Management:   ? ?Induction: Intravenous and Rapid sequence ? ?PONV Risk Score and Plan: 3 and Ondansetron, Dexamethasone and Treatment may vary due to age or medical condition ? ?Airway Management Planned: Oral ETT ? ?Additional Equipment: Arterial line ? ?Intra-op Plan:  ? ?Post-operative Plan: Possible Post-op intubation/ventilation ? ?Informed Consent: I have reviewed the patients History and Physical, chart, labs and discussed the procedure including the risks, benefits and alternatives for the proposed anesthesia with the patient or authorized representative who has indicated his/her understanding and acceptance.  ? ? ? ?Dental advisory given and Only emergency history available ? ?Plan Discussed with: CRNA ? ?Anesthesia Plan Comments: (Lab Results ?     Component                Value               Date                 ?     WBC                       8.2                 04/08/2021           ?     HGB                      16.7 (H)            04/08/2021           ?     HCT                      49.0 (H)            04/08/2021           ?     MCV                      98.7                04/08/2021           ?     PLT  317                 04/08/2021           ?Lab Results ?     Component                Value               Date                 ?     NA                       133 (L)             04/08/2021           ?     K                        4.3                 04/08/2021           ?     CO2                      22                  04/08/2021           ?     GLUCOSE                  105 (H)             04/08/2021           ?     BUN                      14                  04/08/2021           ?     CREATININE               0.70                04/08/2021           ?     CALCIUM                  10.3                04/08/2021           ?     GFRNONAA                 >60                 04/08/2021          )  ? ? ? ? ? ? ?Anesthesia Quick Evaluation ? ?

## 2021-04-08 NOTE — ED Triage Notes (Addendum)
Pt states at 11am she started having R sided weakness/numbness, dropped something out of R hand, unable to get up, and was drooling.  States face has resolved but continues to have R sided numbness/weakness with slight R arm drift.  Code Stroke activated and pt straight to bridge.  VAN negative. ?

## 2021-04-08 NOTE — H&P (Signed)
History and Physical    Patient: Angela Jenkins UXL:[id number] DOB: 29-May-1945 DOA: 04/08/2021 DOS: the patient was seen and examined on 04/08/2021 PCP: Carol Ada, MD  Patient coming from: Home - lives with husband    Chief Complaint: stroke like symptoms   HPI: Oma Marzan is a 76 y.o. female with medical history significant of HTN, HLD, glaucoma and legal blindness presenting to ED with sudden onset right sided weakness/numbness and drooling. Symptoms started around 11-11:30am.  Per husband he laid down around 10am and she was her normal self. Around 11:30 he heard something drop and got up and found the computer mouse had fallen to the ground and his wife was slumped over in the chair and couldn't respond and "looked funny." She was drooling out of the right side of her mouth and couldn't talk or get out of her chair. He immediately called EMS who arrived there quickly and symptoms were already starting to improve. She states symptoms have nearly resolved except her right foot is still tight.   He tells me she had 2 other incidents similar to this about 3 months and then 5 months ago. Both she had weakness, drooling and inability to talk that was transient and she refused to come to hospital.   She denies any fever/chills,headaches and does have dizziness on standing for years, no chest pain or palpitations, shortness of breath, cough, abdominal pain, N/V/D, dysuria or leg swelling.   +CVA in father at age 110 years.  She does not smoke, drinks wine daily. Couple glasses/daily.   At baseline she is independent with all ADLs. Walks a few miles daily, still driving and active. Does not use an assisted devices.     ER Course:  vitals:   bp: 152/75, HR: 97, RR: 17, oxygen: 94% on RA Pertinent labs: none CT head: no acute finding CTA head/neck: occluded left ICA from origin and remains occluded throughout remainder of neck. Occlusion of a proximal M2 left MCA vessel In ED: code stroke  activated. Neurology consulted. Started on plavix/ASA, given 500cc NS bolus and lipitor.    Review of Systems: As mentioned in the history of present illness. All other systems reviewed and are negative. Past Medical History:  Diagnosis Date   Glaucoma    Hypertension    Past Surgical History:  Procedure Laterality Date   EXTERNAL EAR SURGERY     TONSILLECTOMY     Social History:  reports that she has quit smoking. She does not have any smokeless tobacco history on file. She reports current alcohol use. She reports that she does not use drugs.  Allergies  Allergen Reactions   Brimonidine Other (See Comments) and Cough    Dry mouth, dizziness, very sleepy    Family History  Problem Relation Age of Onset   Heart attack Mother    Stroke Father    Hypertension Brother     Prior to Admission medications   Medication Sig Start Date End Date Taking? Authorizing Provider  amLODipine (NORVASC) 5 MG tablet Take 5 mg by mouth daily. 02/01/21   [provider]  dorzolamide-timolol (COSOPT) 22.3-6.8 MG/ML ophthalmic solution Place 1 drop into both eyes 2 (two) times daily.    [provider]  hydrochlorothiazide (HYDRODIURIL) 12.5 MG tablet Take 12.5 mg by mouth daily. 02/01/21   [provider]  HYDROcodone-acetaminophen (NORCO/VICODIN) 5-325 MG tablet Take 1 tablet by mouth every 6 (six) hours as needed for severe pain. 11/29/19   Caccavale, Sophia, PA-C  latanoprost (  XALATAN) 0.005 % ophthalmic solution Place 1 drop into both eyes at bedtime.    [provider]  losartan (COZAAR) 100 MG tablet Take 100 mg by mouth daily. 03/30/21   [provider]    Physical Exam: Vitals:   04/08/21 1400 04/08/21 1445 04/08/21 1500 04/08/21 1659  BP: (!) 163/87 (!) 146/107 134/72 (!) 142/82  Pulse: 92 87 86 99  Resp: 15 (!) '23 15 20  '$ Temp:    98.3 F (36.8 C)  TempSrc:    Oral  SpO2: 98% 100% 98% 98%  Weight:       General:  Appears calm and  comfortable and is in NAD Eyes:  PERRL, EOMI, normal lids, iris ENT:  grossly normal hearing, lips & tongue, mmm; appropriate dentition Neck:  no LAD, masses or thyromegaly; no carotid bruits Cardiovascular:  RRR, no m/r/g. No LE edema.  Respiratory:   CTA bilaterally with no wheezes/rales/rhonchi.  Normal respiratory effort. Abdomen:  soft, NT, ND, NABS Back:   normal alignment, no CVAT Skin:  no rash or induration seen on limited exam Musculoskeletal:  grossly normal tone BUE/BLE, good ROM, no bony abnormality. Right foot stays in plantar flexion position.  Lower extremity:  No LE edema.  Limited foot exam with no ulcerations.  2+ distal pulses. Psychiatric:  grossly normal mood and affect, speech fluent and appropriate, AOx3 Neurologic:  CN 2-12 grossly intact, moves all extremities in coordinated fashion, sensation intact. DTR-2+, HTK intact bilaterally, FTN intact bilaterally. Gait deferred.    Radiological Exams on Admission: Independently reviewed - see discussion in A/P where applicable  MR BRAIN WO CONTRAST  Result Date: 04/08/2021 CLINICAL DATA:  Provided history: Neuro deficit, acute, stroke suspected. EXAM: MRI HEAD WITHOUT CONTRAST TECHNIQUE: Multiplanar, multiecho pulse sequences of the brain and surrounding structures were obtained without intravenous contrast. COMPARISON:  CT angiogram head/neck and non-contrast head CT performed earlier today 04/08/2021. FINDINGS: Brain: Mild-to-moderate generalized cerebral and cerebellar atrophy. Punctate acute or cortical infarct within the left parietal lobe (series 2, image 41 (left MCA vascular territory). Small chronic cortical infarct within the lateral left frontoparietal lobes (series 6, image 26). Mild multifocal T2 FLAIR hyperintense signal abnormality within the cerebral white matter, nonspecific but compatible with chronic small vessel ischemic disease. No evidence of an intracranial mass. No chronic intracranial blood products. No  extra-axial fluid collection. No midline shift. Vascular: Signal abnormality within the petrous and proximal cavernous left ICA at site of known vessel occlusion. Focus of susceptibility weighted signal loss within the left sylvian fissure, likely reflecting thrombus within an occluded proximal M2 left MCA vessel (series 7, image 53). Skull and upper cervical spine: No focal suspicious marrow lesion. Sinuses/Orbits: Visualized orbits show no acute finding. Prior left ocular lens replacement. Mild mucosal thickening within the bilateral maxillary sinuses. Trace mucosal thickening within the bilateral ethmoid sinuses. IMPRESSION: Punctate acute cortical infarct within the left parietal lobe (left MCA vascular territory). Small chronic cortically-based infarct within the lateral left frontoparietal lobes (left MCA vascular territory). Mild chronic small vessel ischemic changes within the cerebral white matter. Signal abnormality within the petrous and proximal cavernous segments of the left ICA, at site of known vessel occlusion. Focus of susceptibility-weighted signal loss within the left sylvian fissure, likely reflecting thrombus within an occluded proximal M2 left MCA vessel (as was demonstrated on the CTA head performed earlier today). Mild-to-moderate generalized cerebral and cerebellar atrophy. Paranasal sinus disease, as described. Electronically Signed   By: Kellie Simmering D.O.   On:  04/08/2021 16:19   ECHOCARDIOGRAM COMPLETE  Result Date: 04/08/2021    ECHOCARDIOGRAM REPORT   Patient Name:   SOHA THORUP Date of Exam: 04/08/2021 Medical Rec #:  [id number]   Height:       63.0 in Accession #:    [id number]  Weight:       121.3 lb Date of Birth:  14-Jan-1946   BSA:          1.563 m Patient Age:    53 years    BP:           134/72 mmHg Patient Gender: F           HR:           87 bpm. Exam Location:  Inpatient Procedure: 2D Echo, Cardiac Doppler and Color Doppler Indications:    Stroke  History:        Patient has  no prior history of Echocardiogram examinations.  Sonographer:    Merrie Roof RDCS Referring Phys: [id number] [place]  1. Left ventricular ejection fraction, by estimation, is 60 to 65%. The left ventricle has normal function. The left ventricle has no regional wall motion abnormalities. Left ventricular diastolic parameters were normal.  2. Right ventricular systolic function is normal. The right ventricular size is normal. There is normal pulmonary artery systolic pressure.  3. The mitral valve is normal in structure. No evidence of mitral valve regurgitation. No evidence of mitral stenosis.  4. The aortic valve is normal in structure. Aortic valve regurgitation is not visualized. No aortic stenosis is present. FINDINGS  Left Ventricle: Left ventricular ejection fraction, by estimation, is 60 to 65%. The left ventricle has normal function. The left ventricle has no regional wall motion abnormalities. The left ventricular internal cavity size was normal in size. There is  no left ventricular hypertrophy. Left ventricular diastolic parameters were normal. Right Ventricle: The right ventricular size is normal. Right vetricular wall thickness was not well visualized. Right ventricular systolic function is normal. There is normal pulmonary artery systolic pressure. The tricuspid regurgitant velocity is 2.17 m/s, and with an assumed right atrial pressure of 3 mmHg, the estimated right ventricular systolic pressure is 06.0 mmHg. Left Atrium: Left atrial size was normal in size. Right Atrium: Right atrial size was normal in size. Pericardium: There is no evidence of pericardial effusion. Mitral Valve: The mitral valve is normal in structure. No evidence of mitral valve regurgitation. No evidence of mitral valve stenosis. Tricuspid Valve: The tricuspid valve is normal in structure. Tricuspid valve regurgitation is trivial. Aortic Valve: The aortic valve is normal in structure. Aortic valve regurgitation is  not visualized. No aortic stenosis is present. Aortic valve mean gradient measures 3.0 mmHg. Aortic valve peak gradient measures 6.2 mmHg. Aortic valve area, by VTI measures 1.78 cm. Pulmonic Valve: The pulmonic valve was not well visualized. Pulmonic valve regurgitation is not visualized. Aorta: The aortic root and ascending aorta are structurally normal, with no evidence of dilitation. IAS/Shunts: The atrial septum is grossly normal.  LEFT VENTRICLE PLAX 2D LVIDd:         4.00 cm   Diastology LVIDs:         2.70 cm   LV e' medial:    5.66 cm/s LV PW:         0.90 cm   LV E/e' medial:  11.4 LV IVS:        1.00 cm   LV e' lateral:   7.07 cm/s LVOT diam:  1.70 cm   LV E/e' lateral: 9.1 LV SV:         45 LV SV Index:   29 LVOT Area:     2.27 cm  RIGHT VENTRICLE RV Basal diam:  3.10 cm LEFT ATRIUM             Index        RIGHT ATRIUM          Index LA diam:        2.70 cm 1.73 cm/m   RA Area:     9.96 cm LA Vol (A2C):   40.1 ml 25.65 ml/m  RA Volume:   18.70 ml 11.96 ml/m LA Vol (A4C):   34.5 ml 22.07 ml/m LA Biplane Vol: 37.7 ml 24.12 ml/m  AORTIC VALVE AV Area (Vmax):    1.76 cm AV Area (Vmean):   1.73 cm AV Area (VTI):     1.78 cm AV Vmax:           124.00 cm/s AV Vmean:          87.200 cm/s AV VTI:            0.254 m AV Peak Grad:      6.2 mmHg AV Mean Grad:      3.0 mmHg LVOT Vmax:         96.00 cm/s LVOT Vmean:        66.500 cm/s LVOT VTI:          0.199 m LVOT/AV VTI ratio: 0.78  AORTA Ao Root diam: 2.80 cm MITRAL VALVE                TRICUSPID VALVE MV Area (PHT): 3.43 cm     TR Peak grad:   18.8 mmHg MV Decel Time: 221 msec     TR Vmax:        217.00 cm/s MV E velocity: 64.30 cm/s MV A velocity: 104.00 cm/s  SHUNTS MV E/A ratio:  0.62         Systemic VTI:  0.20 m                             Systemic Diam: 1.70 cm Mertie Moores MD Electronically signed by Mertie Moores MD Signature Date/Time: 04/08/2021/5:24:55 PM    Final    CT HEAD CODE STROKE WO CONTRAST  Result Date: 04/08/2021 CLINICAL  DATA:  Code stroke. Neuro deficit, acute, stroke suspected. Right-sided weakness. EXAM: CT HEAD WITHOUT CONTRAST TECHNIQUE: Contiguous axial images were obtained from the base of the skull through the vertex without intravenous contrast. RADIATION DOSE REDUCTION: This exam was performed according to the departmental dose-optimization program which includes automated exposure control, adjustment of the mA and/or kV according to patient size and/or use of iterative reconstruction technique. COMPARISON:  No pertinent prior exams available for comparison. FINDINGS: Brain: Mild-to-moderate generalized cerebral and cerebellar atrophy. Mild ill-defined hypoattenuation within the periventricular white matter, nonspecific but compatible chronic small vessel ischemic disease. There is no acute intracranial hemorrhage. No demarcated cortical infarct. No extra-axial fluid collection. No evidence of an intracranial mass. No midline shift. Vascular: No hyperdense vessel.  Atherosclerotic calcifications. Skull: Normal. Negative for fracture or focal lesion. Sinuses/Orbits: Visualized orbits show no acute finding. No significant paranasal sinus disease at the imaged levels. ASPECTS [facility] Stroke Program Early CT Score) - Ganglionic level infarction (caudate, lentiform nuclei, internal capsule, insula, M1-M3 cortex): 7 - Supraganglionic infarction (M4-M6 cortex): 3 Total  score (0-10 with 10 being normal): 10 These results were communicated to Dr. Erlinda Hong At 12:33 pmon 3/4/2023by text page via the [facility] messaging system. IMPRESSION: No evidence of acute intracranial abnormality. Mild chronic small vessel given changes within the cerebral white matter. Mild-to-moderate generalized parenchymal atrophy. Electronically Signed   By: Kellie Simmering D.O.   On: 04/08/2021 12:33   CT ANGIO HEAD NECK W WO CM (CODE STROKE)  Result Date: 04/08/2021 CLINICAL DATA:  Provided history: Code stroke. Right-sided weakness. EXAM: CT ANGIOGRAPHY HEAD AND  NECK TECHNIQUE: Multidetector CT imaging of the head and neck was performed using the standard protocol during bolus administration of intravenous contrast. Multiplanar CT image reconstructions and MIPs were obtained to evaluate the vascular anatomy. Carotid stenosis measurements (when applicable) are obtained utilizing NASCET criteria, using the distal internal carotid diameter as the denominator. RADIATION DOSE REDUCTION: This exam was performed according to the departmental dose-optimization program which includes automated exposure control, adjustment of the mA and/or kV according to patient size and/or use of iterative reconstruction technique. CONTRAST:  66m OMNIPAQUE IOHEXOL 350 MG/ML SOLN COMPARISON:  Noncontrast head CT performed earlier today 04/08/2021. FINDINGS: CTA NECK FINDINGS Aortic arch: Standard aortic branching. Atherosclerotic plaque within the visualized aortic arch and proximal major branch vessels of the neck. Streak and beam hardening artifact arising from a dense right-sided contrast bolus partially obscures the right subclavian artery. Within this limitation, there is no appreciable hemodynamically significant stenosis within the innominate or proximal subclavian arteries. Right carotid system: CCA and ICA patent within the neck without significant stenosis (50% or greater). Mild atherosclerotic plaque about the carotid bifurcation. Left carotid system: The common carotid artery is patent. 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 Atherosclerotic plaque within the distal CCA and about the carotid bifurcation. Vertebral arteries: Vertebral arteries codominant and patent within the neck without stenosis. Skeleton: Cervical spondylosis. No acute bony abnormality or aggressive osseous lesion. Grade 1 anterolisthesis at C2-C3, C3-C4 and C4-C5. Other neck: Subcentimeter thyroid nodules, not meeting consensus criteria for  ultrasound follow-up based on size. Upper chest: No consolidation within the imaged lung apices. Review of the MIP images confirms the above findings CTA HEAD FINDINGS Anterior circulation: The intracranial right ICA is patent. 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 There is reconstitution of enhancement within the intracranial left ICA beginning at the cavernous segment. The M1 middle cerebral arteries are patent. There is occlusion of a proximal M2 left MCA vessel (series 12, image 28). The anterior cerebral arteries are patent. No intracranial aneurysm is identified. Posterior circulation: The intracranial vertebral arteries are patent. The basilar artery is patent. Short segment fenestration within the proximal basilar artery. The posterior cerebral arteries are patent. Posterior communicating arteries are diminutive or absent bilaterally. Venous sinuses: Within the limitations of contrast timing, no convincing thrombus. Anatomic variants: As described. Review of the MIP images confirms the above findings CTA head impression called by telephone at the time of interpretation on 04/08/2021 at 1:03 pm [facility] who verbally acknowledged these results. IMPRESSION: CTA neck: 1. The left common carotid artery is patent. 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 2. The right common carotid, right internal carotid and bilateral vertebral arteries are patent within the neck without hemodynamically significant stenosis. 3.  Aortic Atherosclerosis (ICD10-I70.0). CTA head: 1. The intracranial left ICA remains occluded throughout  the petrous segment. There is reconstitution of enhancement within the intracranial left ICA beginning at the cavernous segment. 2. There is occlusion of a proximal M2 left MCA vessel (series 12, image 28). Electronically Signed   By: Kellie Simmering D.O.   On: 04/08/2021 13:04     EKG: Independently reviewed.  NSR with rate 88; nonspecific ST changes with no evidence of acute ischemia   Labs on Admission: I have personally reviewed the available labs and imaging studies at the time of the admission.  Pertinent labs:   none    Assessment and Plan: * Cerebral embolism with cerebral infarction 76 year old female presenting with acute onset of right sided weakness, drooling with near resolution of symptoms. Stroke risk factors include age, HTN, HLD. MRI shows acute cortical infarct within left parietal lobe.  -place in observation on telemetry for stroke w/u. Not IR candidate given non disabling symptoms and chronic left ICA occlusion.  -Neurochecks per protocol -Neurology consulted -MRI brain without contrast -echo -a1c/lipid panel in AM, started on statin.  -CTA neck shows occluded left ICA - start aspirin 81 mg and start Plavix 75 mg x 3 weeks neurology recommendation -Permissive hypertension first 24 hours <220/110 -N.p.o. until bedside swallow screen-passed  -PT/ OT/ SLP consult  Carotid artery stenosis, symptomatic, left CTA head/neck shows completely occluded left ICA. ? If lead to blindness in left eye and possible CVA?  No intervention per neurology-too high risk  Plavix/ASA/statin initiated   Primary hypertension Patient on losartan, hctz and norvasc with complaints of dizziness with standing x 1 year -check orthostatics -hold anti hypertensive in setting of possible acute CVA -gentle IVF   Hyperlipidemia Started on lipitor '40mg'$  today Fasting lipid panel in AM   Glaucoma Continue drops     Advance Care Planning:   Code Status: Full Code   Consults: neurology: Dr. Erlinda Hong  DVT Prophylaxis: lovenox   Family Communication: husband at bedside: Birdie Riddle   Severity of Illness: The appropriate patient status for this patient is OBSERVATION. Observation status is judged to be reasonable and necessary in order to provide the required  intensity of service to ensure the patient's safety. The patient's presenting symptoms, physical exam findings, and initial radiographic and laboratory data in the context of their medical condition is felt to place them at decreased risk for further clinical deterioration. Furthermore, it is anticipated that the patient will be medically stable for discharge from the hospital within 2 midnights of admission.   Author: Orma Flaming, MD 04/08/2021 5:31 PM  For on call review [email or web address].

## 2021-04-08 NOTE — ED Notes (Signed)
BSC provided  ?

## 2021-04-08 NOTE — Progress Notes (Signed)
Husbands phone number [phone], home land line, no mobile number.  ?Husband requests to be first point of contact for future communication.  ?Sons phone number [phone], mobile number.  ?

## 2021-04-08 NOTE — Anesthesia Procedure Notes (Signed)
Procedure Name: Intubation ?Date/Time: 04/08/2021 7:47 PM ?Performed by: Valetta Fuller, CRNA ?Pre-anesthesia Checklist: Patient identified, Emergency Drugs available, Suction available and Patient being monitored ?Patient Re-evaluated:Patient Re-evaluated prior to induction ?Oxygen Delivery Method: Circle system utilized ?Preoxygenation: Pre-oxygenation with 100% oxygen ?Induction Type: IV induction, Rapid sequence and Cricoid Pressure applied ?Laryngoscope Size: Glidescope and 3 ?Grade View: Grade I ?Tube type: Oral ?Tube size: 7.5 mm ?Number of attempts: 1 ?Airway Equipment and Method: Stylet ?Placement Confirmation: ETT inserted through vocal cords under direct vision, positive ETCO2 and breath sounds checked- equal and bilateral ?Secured at: 22 cm ?Tube secured with: Tape ?Dental Injury: Teeth and Oropharynx as per pre-operative assessment  ? ? ? ? ?

## 2021-04-08 NOTE — ED Notes (Signed)
Paged code stroke

## 2021-04-08 NOTE — Assessment & Plan Note (Addendum)
Patient on losartan, hctz and norvasc with complaints of dizziness with standing x 1 year ?-check orthostatics ?-hold anti hypertensive in setting of possible acute CVA ?-gentle IVF  ?

## 2021-04-08 NOTE — Assessment & Plan Note (Addendum)
76 year old female presenting with acute onset of right sided weakness, drooling with near resolution of symptoms. Stroke risk factors include age, HTN, HLD. MRI shows acute cortical infarct within left parietal lobe.  ?-place in observation on telemetry for stroke work-up. Not IR candidate given non disabling symptoms and chronic left ICA occlusion.  ?-Neurochecks per protocol ?-Neurology consulted ?-MRI brain without contrast ?-echo ?-a1c/lipid panel in AM, started on statin.  ?-CTA neck shows occluded left ICA ?- start aspirin 81 mg and start Plavix 75 mg x 3 weeks neurology recommendation ?-Permissive hypertension first 24 hours <220/110 ?-N.p.o. until bedside swallow screen-passed  ?-PT/ OT/ SLP consult ? ?

## 2021-04-08 NOTE — Transfer of Care (Signed)
Immediate Anesthesia Transfer of Care Note ? ?Patient: Angela Jenkins ? ?Procedure(s) Performed: IR WITH ANESTHESIA ? ?Patient Location: NICU ? ?Anesthesia Type:General ? ?Level of Consciousness: Patient remains intubated per anesthesia plan ? ?Airway & Oxygen Therapy: Patient placed on Ventilator (see vital sign flow sheet for setting) ? ?Post-op Assessment: Report given to RN and Post -op Vital signs reviewed and stable ? ?Post vital signs: Reviewed and stable ? ?Last Vitals:  ?Vitals Value Taken Time  ?BP    ?Temp    ?Pulse    ?Resp    ?SpO2    ? ? ?Last Pain:  ?Vitals:  ? 04/08/21 1659  ?TempSrc: Oral  ?PainSc:   ?   ? ?  ? ?Complications: No notable events documented. ?

## 2021-04-08 NOTE — Progress Notes (Addendum)
eLink Physician-Brief Progress Note ?Patient Name: Angela Jenkins ?DOB: August 05, 1945 ?MRN: [id number] ? ? ?Date of Service ? 04/08/2021  ?HPI/Events of Note ? Brief New admit note: ?24 Y RT H F MRS 1. LSW 11am  ?New onset RT sided weakness and aphasia.  ?CT head no ICH ASPECTS ?10. 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 ?Ballooon angioplasty of pre occlusive Lt ICA prox  to stable 70% patency post angioplasty. ?Post CT brain shows contrast blush in the Lt putamen. ?Also mild Lt high parietal subarachnoid hyp. ?er attenuation ? Contrast +/_ hemorrhage ? ?Data: ?reviewed ?7.37/39/418/23 ? ?Camera evaluation done: ?In syncrony with Vent 400/06/18/28%, PIP 17 ?Srt line MAP 89, HR 81, 130/53. 99% sars ?Propofol at 50, on clevidepine at 8 mg/hr.  ?A/P: ? ?VTE:SCD ?CBG goals < 180. on SSI.  ?SUP Protonix. ?Vent bundle: SAT-SBT daily when stable hemodynamics. Lung protective ventilation.  ?  ?eICU Interventions ? Follow MRI ?Neuro checks ?Propofol and fenta prn ordered.  ?- continue care.   ? ? ? ?Intervention Category ?Major Interventions: Other: (CVA s/p aspiration and angioplasty L MCA embolic) ?Evaluation Type: New Patient Evaluation ? ?KElmer Sow?04/08/2021, 11:01 PM ? ?6:17 ?MRI report reviewed. No bleeding. Continue care.  ?

## 2021-04-08 NOTE — Significant Event (Signed)
Received secure chat from nurse at 6:30pm that patient had new right sided deficits, slurred speech and facial droop. Neurology notified-Dr. Lorrin Goodell. Code stroke activated. Patient examined and had new right sided facial droop with dysarthric speech, right sided weakness (0/5). Patient taken to CT emergently.  ? ?CTA shows new M1 left middle cerebral artery occluded shortly beyond its origin.  ?Also new subtle linear thrombus questioned within the distal cavernous left ICA.  ? ?Dr. Lorrin Goodell has called IR and has been taken for procedure and neurology will take over care.  ? ?Dr. Rogers Blocker,  ?[facility].  ? ?

## 2021-04-08 NOTE — Progress Notes (Signed)
Pt's wig and 3 gold colored rings (1 band and 2 with clear stones) given to the patient's husband.  ?

## 2021-04-08 NOTE — Progress Notes (Signed)
?  Echocardiogram ?2D Echocardiogram has been performed. ? ?Elmer Ramp ?04/08/2021, 4:42 PM ?

## 2021-04-08 NOTE — Assessment & Plan Note (Signed)
Continue drops  

## 2021-04-08 NOTE — Procedures (Signed)
INR. ?Bilateral common carotid arteriograms. ?RT CFA approach.Marland Kitchen ?Findings. ?1Occluded  LT MCA M 1 seg with x1 pass with 98mx 40 mm solitaire X retriever and contact aspiration achieving a TICI 3 revascularization. ?Ballooon angioplasty of pre occlusive Lt ICA prox  to stable 70% patency post angioplasty. ?Post CT brain shows contrast blush in the Lt putamen. ?Also mild Lt high parietal subarachnoid hyp. ?er attenuation ? Contrast +/_ hemorrhage ? ?87F angioseal for hemostasis at the Rt groin access site. Distal pulses all intact. ?Patient left intubated in view of neuro deficit. ? ?S.Makayla Lanter MD ? ?

## 2021-04-08 NOTE — Code Documentation (Signed)
Responded to Code Stroke called at 1846 for R sided facial droop, R arm/leg weakness, LSN-1100, NIH-12. Pt was admitted to ED earlier today as a Code Stroke for R sided numbness, LSN-1100, CT head neg, CTA-L ICA occlusion.  TNK not given at that time d/t NIH -0. Pt was then taken to MRI and returned with worsening symptoms. Code Stroke then reactivated and pt taken to CT. CT head-no hemorrhage, CTA-L M1 occlusion, L ICA now patent, CTP-86cc penumbra, 6cc core infarct. IR activated at 1910. Pt prepped and txed to IR, arriving in IR suite at [facility]. Plan for ICU admission post procedure.   ?

## 2021-04-08 NOTE — Assessment & Plan Note (Signed)
76 year old female presenting with acute onset of right sided weakness, drooling with near resolution of symptoms. Stroke risk factors include age, HTN, HLD.  ?-place in observation on telemetry for TIA/stroke work-up ?-Neurochecks per protocol ?-Neurology consulted ?-MRI brain without contrast ?-echo ?-a1c/lipid panel in AM, started on statin.  ?-CTA neck shows occluded left ICA ?- start aspirin 81 mg and start Plavix 75 mg x 3 weeks neurology recommendation ?-Permissive hypertension first 24 hours <220/110 ?-N.p.o. until bedside swallow screen-passed  ?-PT/ OT/ SLP consult ? ?

## 2021-04-08 NOTE — ED Notes (Addendum)
Rounded on Pt after returning from MRI.  Pt has R sided weakness and facial droop.  Pt reports sensory deficit in R leg.   ? ?Pt did not have facial drop when NT performed COVID test at 1749.   ?

## 2021-04-08 NOTE — ED Notes (Signed)
Code Stroke re-activated at 1847 due to new and recurrent deficits. Patient immediately to CT ?

## 2021-04-08 NOTE — Progress Notes (Signed)
Patient ID: Lenetta Quaker, female   DOB: May 11, 1945, 76 y.o.   MRN: [id number] ?INR. ?31 Y RT H F MRS 1. LSW 11am  ?New onset RT sided weakness and aphasia.  ?CT head no ICH ASPECTS ?10. 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 ?Endovascular treatment D/W spouse over a 3 way call. Procedure,reasons,alternatives reviewed. Risks of ICH of 10 % ,worsening neuro deficit death and inability to revascularize discussed. Marland Kitchen ?Husband expressed understanding and provided consent to proceed. ?S.Rashad Obeid MD ? ?

## 2021-04-08 NOTE — ED Provider Notes (Signed)
[facility] EMERGENCY DEPARTMENT Provider Note   CSN: [id number] Arrival date & time: 04/08/21  1210     History  Chief Complaint  Patient presents with   Code Stroke    Angela Jenkins is a 76 y.o. female. With past medical history of hypertension presents to ED as Code Stroke.  Patient is alert and oriented on presentation. LKW 11:45AM. States that she was at the computer when she dropped her mouse. She does not remember anything after this. Husband states he woke up from a nap in the same room when she dropped the mouse. States he noticed her right sided facial droop, and drooling. Patient states she then remembers not being able to move the right side of her body. Husband states episode lasted around 5 minutes when she began improving. Patient endorses improvement in symptoms but continues to have right sided numbness.   HPI     Home Medications Prior to Admission medications   Medication Sig Start Date End Date Taking? Authorizing Provider  dorzolamide-timolol (COSOPT) 22.3-6.8 MG/ML ophthalmic solution Place 1 drop into both eyes 2 (two) times daily.    [provider]  HYDROcodone-acetaminophen (NORCO/VICODIN) 5-325 MG tablet Take 1 tablet by mouth every 6 (six) hours as needed for severe pain. 11/29/19   Caccavale, Sophia, PA-C  latanoprost (XALATAN) 0.005 % ophthalmic solution 1 drop at bedtime.    [provider]      Allergies    Patient has no known allergies.    Review of Systems   Review of Systems  Neurological:  Positive for speech difficulty, weakness and numbness.  All other systems reviewed and are negative.  Physical Exam Updated Vital Signs BP (!) 152/78    Pulse 87    Resp 11    Wt 55 kg    SpO2 94%    BMI 21.48 kg/m  Physical Exam Vitals and nursing note reviewed.  Constitutional:      General: She is not in acute distress.    Appearance: Normal appearance. She is not ill-appearing.  HENT:     Head: Normocephalic  and atraumatic.     Nose: Nose normal.     Mouth/Throat:     Mouth: Mucous membranes are moist.     Pharynx: Oropharynx is clear. No posterior oropharyngeal erythema.  Eyes:     General: No visual field deficit or scleral icterus.    Extraocular Movements: Extraocular movements intact.     Conjunctiva/sclera: Conjunctivae normal.     Pupils: Pupils are equal, round, and reactive to light.  Cardiovascular:     Rate and Rhythm: Normal rate and regular rhythm.     Pulses: Normal pulses.     Heart sounds: Normal heart sounds. No murmur heard. Pulmonary:     Effort: Pulmonary effort is normal. No respiratory distress.     Breath sounds: Normal breath sounds.  Abdominal:     General: Bowel sounds are normal. There is no distension.     Palpations: Abdomen is soft.     Tenderness: There is no abdominal tenderness.  Musculoskeletal:        General: Normal range of motion.     Cervical back: Normal range of motion and neck supple.  Skin:    General: Skin is warm and dry.     Capillary Refill: Capillary refill takes less than 2 seconds.  Neurological:     General: No focal deficit present.     Mental Status: She is alert and oriented  to person, place, and time.     GCS: GCS eye subscore is 4. GCS verbal subscore is 5. GCS motor subscore is 6.     Cranial Nerves: Cranial nerves 2-12 are intact. No cranial nerve deficit, dysarthria or facial asymmetry.     Sensory: No sensory deficit.     Motor: Weakness present. No tremor or pronator drift.     Coordination: Coordination is intact. Coordination normal. Finger-Nose-Finger Test normal. Rapid alternating movements normal.     Gait: Gait normal.     Comments: Strength 4/5 RLE  Psychiatric:        Mood and Affect: Mood normal.        Behavior: Behavior normal.        Thought Content: Thought content normal.        Judgment: Judgment normal.    ED Results / Procedures / Treatments   Labs (all labs ordered are listed, but only abnormal  results are displayed) Labs Reviewed  CBC - Abnormal; Notable for the following components:      Result Value   Hemoglobin 15.9 (*)    MCH 34.3 (*)    All other components within normal limits  COMPREHENSIVE METABOLIC PANEL - Abnormal; Notable for the following components:   Sodium 134 (*)    Chloride 97 (*)    Glucose, Bld 110 (*)    All other components within normal limits  I-STAT CHEM 8, ED - Abnormal; Notable for the following components:   Sodium 133 (*)    Glucose, Bld 105 (*)    Calcium, Ion 1.14 (*)    Hemoglobin 16.7 (*)    HCT 49.0 (*)    All other components within normal limits  PROTIME-INR  APTT  DIFFERENTIAL  CBG MONITORING, ED   EKG EKG Interpretation  Date/Time:  Saturday April 08 2021 13:12:42 EST Ventricular Rate:  88 PR Interval:  162 QRS Duration: 88 QT Interval:  381 QTC Calculation: 461 R Axis:   64 Text Interpretation: Sinus rhythm Baseline wander in lead(s) V6 Confirmed by Dene Gentry [phone]) on 04/08/2021 1:20:05 PM  Radiology CT HEAD CODE STROKE WO CONTRAST  Result Date: 04/08/2021 CLINICAL DATA:  Code stroke. Neuro deficit, acute, stroke suspected. Right-sided weakness. EXAM: CT HEAD WITHOUT CONTRAST TECHNIQUE: Contiguous axial images were obtained from the base of the skull through the vertex without intravenous contrast. RADIATION DOSE REDUCTION: This exam was performed according to the departmental dose-optimization program which includes automated exposure control, adjustment of the mA and/or kV according to patient size and/or use of iterative reconstruction technique. COMPARISON:  No pertinent prior exams available for comparison. FINDINGS: Brain: Mild-to-moderate generalized cerebral and cerebellar atrophy. Mild ill-defined hypoattenuation within the periventricular white matter, nonspecific but compatible chronic small vessel ischemic disease. There is no acute intracranial hemorrhage. No demarcated cortical infarct. No extra-axial fluid  collection. No evidence of an intracranial mass. No midline shift. Vascular: No hyperdense vessel.  Atherosclerotic calcifications. Skull: Normal. Negative for fracture or focal lesion. Sinuses/Orbits: Visualized orbits show no acute finding. No significant paranasal sinus disease at the imaged levels. ASPECTS [facility] Stroke Program Early CT Score) - Ganglionic level infarction (caudate, lentiform nuclei, internal capsule, insula, M1-M3 cortex): 7 - Supraganglionic infarction (M4-M6 cortex): 3 Total score (0-10 with 10 being normal): 10 These results were communicated to Dr. Erlinda Hong At 12:33 pmon 3/4/2023by text page via the [facility] messaging system. IMPRESSION: No evidence of acute intracranial abnormality. Mild chronic small vessel given changes within the cerebral white matter. Mild-to-moderate generalized  parenchymal atrophy. Electronically Signed   By: Kellie Simmering D.O.   On: 04/08/2021 12:33   CT ANGIO HEAD NECK W WO CM (CODE STROKE)  Result Date: 04/08/2021 CLINICAL DATA:  Provided history: Code stroke. Right-sided weakness. EXAM: CT ANGIOGRAPHY HEAD AND NECK TECHNIQUE: Multidetector CT imaging of the head and neck was performed using the standard protocol during bolus administration of intravenous contrast. Multiplanar CT image reconstructions and MIPs were obtained to evaluate the vascular anatomy. Carotid stenosis measurements (when applicable) are obtained utilizing NASCET criteria, using the distal internal carotid diameter as the denominator. RADIATION DOSE REDUCTION: This exam was performed according to the departmental dose-optimization program which includes automated exposure control, adjustment of the mA and/or kV according to patient size and/or use of iterative reconstruction technique. CONTRAST:  20m OMNIPAQUE IOHEXOL 350 MG/ML SOLN COMPARISON:  Noncontrast head CT performed earlier today 04/08/2021. FINDINGS: CTA NECK FINDINGS Aortic arch: Standard aortic branching. Atherosclerotic plaque within  the visualized aortic arch and proximal major branch vessels of the neck. Streak and beam hardening artifact arising from a dense right-sided contrast bolus partially obscures the right subclavian artery. Within this limitation, there is no appreciable hemodynamically significant stenosis within the innominate or proximal subclavian arteries. Right carotid system: CCA and ICA patent within the neck without significant stenosis (50% or greater). Mild atherosclerotic plaque about the carotid bifurcation. Left carotid system: The common carotid artery is patent. 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 Atherosclerotic plaque within the distal CCA and about the carotid bifurcation. Vertebral arteries: Vertebral arteries codominant and patent within the neck without stenosis. Skeleton: Cervical spondylosis. No acute bony abnormality or aggressive osseous lesion. Grade 1 anterolisthesis at C2-C3, C3-C4 and C4-C5. Other neck: Subcentimeter thyroid nodules, not meeting consensus criteria for ultrasound follow-up based on size. Upper chest: No consolidation within the imaged lung apices. Review of the MIP images confirms the above findings CTA HEAD FINDINGS Anterior circulation: The intracranial right ICA is patent. 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 There is reconstitution of enhancement within the intracranial left ICA beginning at the cavernous segment. The M1 middle cerebral arteries are patent. There is occlusion of a proximal M2 left MCA vessel (series 12, image 28). The anterior cerebral arteries are patent. No intracranial aneurysm is identified. Posterior circulation: The intracranial vertebral arteries are patent. The basilar artery is patent. Short segment fenestration within the proximal basilar artery. The posterior cerebral arteries are patent. Posterior communicating arteries are diminutive or absent  bilaterally. Venous sinuses: Within the limitations of contrast timing, no convincing thrombus. Anatomic variants: As described. Review of the MIP images confirms the above findings CTA head impression called by telephone at the time of interpretation on 04/08/2021 at 1:03 pm [facility], who verbally acknowledged these results. IMPRESSION: CTA neck: 1. The left common carotid artery is patent. 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 2. The right common carotid, right internal carotid and bilateral vertebral arteries are patent within the neck without hemodynamically significant stenosis. 3.  Aortic Atherosclerosis (ICD10-I70.0). CTA head: 1. 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 There is reconstitution of enhancement within the intracranial left ICA beginning at the cavernous segment. 2. There is occlusion of a proximal M2 left MCA vessel (series 12, image 28). Electronically Signed   By: KKellie SimmeringD.O.   On:  04/08/2021 13:04    Procedures Procedures   Medications Ordered in ED Medications  sodium chloride flush (NS) 0.9 % injection 3 mL (has no administration in time range)  sodium chloride 0.9 % bolus 500 mL (has no administration in time range)  0.9 %  sodium chloride infusion (has no administration in time range)  aspirin EC tablet 325 mg (has no administration in time range)  clopidogrel (PLAVIX) tablet 300 mg (has no administration in time range)    Followed by  clopidogrel (PLAVIX) tablet 75 mg (has no administration in time range)  atorvastatin (LIPITOR) tablet 40 mg (has no administration in time range)  iohexol (OMNIPAQUE) 350 MG/ML injection 75 mL (75 mLs Intravenous Contrast Given 04/08/21 1248)    ED Course/ Medical Decision Making/ A&P Clinical Course as of 04/08/21 1230  Sat Apr 08, 2021  1228 CT HEAD CODE STROKE WO CONTRAST [PM]    Clinical Course User  Index [PM] Valarie Merino, MD                           Medical Decision Making Amount and/or Complexity of Data Reviewed Labs: ordered. Radiology: ordered. Decision-making details documented in ED Course.  Risk Decision regarding hospitalization.  This patient presents to the ED for concern of stroke, this involves an extensive number of treatment options, and is a complaint that carries with it a high risk of complications and morbidity.  The differential diagnosis includes acute stroke, TIA, hypoglycemia, seizure, mass   Co morbidities that complicate the patient evaluation Hypertension  Additional history obtained:  Additional history obtained from: Husband at bedside External records from outside source obtained and reviewed including: Previous primary care visits  EKG: EKG: normal EKG, normal sinus rhythm.   Cardiac Monitoring: The patient was maintained on a cardiac monitor.  I personally viewed and interpreted the cardiac monitored which showed an underlying rhythm of: Sinus rhythm  Lab Results: I personally ordered, reviewed, and interpreted labs. Pertinent results include: CMP without severe electrolyte derangement Initial blood glucose 97 Coags within normal limits CBC within normal limits  Imaging Studies ordered:  I ordered imaging studies which included CT.  I independently reviewed & interpreted imaging & am in agreement with radiology impression. Imaging shows: CT head without contrast shows no evidence of acute intracranial abnormality. CT angio head and neck shows left common carotid artery patent, left ICA occluded beginning at its origin and remains occluded throughout the remainder of the neck.   Medications  Dr. Erlinda Hong, neurology ordered medication including aspirin, Plavix, Lipitor for TIA Reevaluation of the patient after medication shows that patient  n/a  Tests Considered: MRI brain  Critical Interventions: Called code  stroke  Consultations: I requested consultation with the neurologist, Dr. Erlinda Hong,  and discussed lab and imaging findings as well as pertinent plan - they recommend: Medicine admission for TIA work-up  ED Course: 76 year old female who presents emergency department with facial droop, right-sided weakness, aphasia.  She was immediately called code stroke upon arrival to the emergency department.  Obtained code stroke labs as well as CT head without, CT angio head and neck.  She was evaluated by Dr. Erlinda Hong with neurology.  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   She had resolution of her symptoms shortly after arriving to the emergency department.  Only symptoms are present when she arrived was continued numbness on the right side.  On my physical exam she has  no focal neurological deficits.  She does have minor, 4/5 weakness of the right lower extremity.  She states that the numbness is consistently improving, she does not have objective signs of sensation difference.  Spoke with Dr. Erlinda Hong at bedside who is placing order for Lipitor, aspirin, Plavix.  He recommends medicine admission for TIA work-up.  He also recommends blood pressure control as well as laying flat or close to flat over the next 24 to 48 hours.  Discussed findings with patient as well as plan for admission.  She is agreeable to this plan.  Spoke with Dr. Rogers Blocker, hospitalist, who agrees to admit the patient at this time.  After consideration of the diagnostic results and the patients response to treatment, I feel that the patent would benefit from admission. The patient has been appropriately medically screened and/or stabilized in the ED. I have low suspicion for any other emergent medical condition which would require further screening, evaluation or treatment in the ED. Final Clinical Impression(s) / ED Diagnoses Final diagnoses:  Right sided weakness    Rx / DC Orders ED Discharge Orders     None          Mickie Hillier, PA-C 04/08/21 1341    Valarie Merino, MD 04/09/21 [phone]

## 2021-04-08 NOTE — Assessment & Plan Note (Signed)
CTA head/neck shows completely occluded left ICA. ? If lead to blindness in left eye and possible CVA?  ?No intervention per neurology-too high risk  ?Plavix/ASA/statin initiated  ?

## 2021-04-08 NOTE — Anesthesia Procedure Notes (Signed)
Arterial Line Insertion ?Start/End3/05/2021 7:37 PM, 04/08/2021 7:42 PM ?Performed by: Darral Dash, DO, anesthesiologist ? Patient location: [facility] procedure area. ?Preanesthetic checklist: patient identified, IV checked, site marked, risks and benefits discussed, surgical consent, monitors and equipment checked, pre-op evaluation, timeout performed and anesthesia consent ?Emergency situation ?Left, radial was placed ?Catheter size: 20 G ?Hand hygiene performed  and maximum sterile barriers used  ? ?Attempts: 2 ?Procedure performed without using ultrasound guided technique. ?Following insertion, dressing applied and Biopatch. ?Post procedure assessment: normal and unchanged ? ?Patient tolerated the procedure well with no immediate complications. ? ? ?

## 2021-04-08 NOTE — Consult Note (Signed)
Stroke Neurology Consultation Note  Consult Requested by: Dr. Francia Greaves  Reason for Consult: code stroke  Consult Date: 04/08/21   The history was obtained from the pt and husband.  During history and examination, all items were able to obtain unless otherwise noted.  History of Present Illness:  Angela Jenkins is a 76 y.o. 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   Per husband, he took a nap at 11am. At that time, pt was working on the computer. Around 11:45am husband woke up by noise of computer mouse dropping to floor. He got up found pt sitting in the chair, not able to get up, not able to talk, right facial droop and right leg weakness. EMS called, on EMS arrival in 56mn, pt symptoms much improved but still has some right leg weakness. In CT suite, her symptoms were minimal. CT no acute finding.  Per patient and wife, patient had 1 similar episode 3 months ago when she got up from chair to standing position with some lightheadedness.  Symptoms resolved really fast.  Another episode 2 months ago when she got up from bed in the morning going to bathroom, she felt lightheadedness and then had again similar episode but resolved less than 5 minutes.  She did not seek medical attention at that time.  Pt baseline active independent, walks a couple of miles everyday. She had left eye sudden vision difficulty 15 years ago and the vision getting worse over time and now legally blind. 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 She takes her BP meds 5:30am everyday.    LSN: Probably 11:45 AM but 11 AM is a definite LSN tPA Given: No: Non-disabling minimal deficit mRS = 1 (left eye legally blind) IR - no, non-distally minimal deficit  Past Medical History:  Diagnosis Date   Glaucoma    Hypertension     Past Surgical History:  Procedure  Laterality Date   EXTERNAL EAR SURGERY     TONSILLECTOMY      Family History  Problem Relation Age of Onset   Heart attack Mother    Stroke Father    Hypertension Brother     Social History:  reports that she has quit smoking. She does not have any smokeless tobacco history on file. She reports current alcohol use. She reports that she does not use drugs.  Allergies: No Known Allergies  No current facility-administered medications on file prior to encounter.   Current Outpatient Medications on File Prior to Encounter  Medication Sig Dispense Refill   amLODipine (NORVASC) 5 MG tablet Take 5 mg by mouth daily.     dorzolamide-timolol (COSOPT) 22.3-6.8 MG/ML ophthalmic solution Place 1 drop into both eyes 2 (two) times daily.     hydrochlorothiazide (HYDRODIURIL) 12.5 MG tablet Take 12.5 mg by mouth daily.     HYDROcodone-acetaminophen (NORCO/VICODIN) 5-325 MG tablet Take 1 tablet by mouth every 6 (six) hours as needed for severe pain. 10 tablet 0   latanoprost (XALATAN) 0.005 % ophthalmic solution Place 1 drop into both eyes at bedtime.     losartan (COZAAR) 100 MG tablet Take 100 mg by mouth daily.      Review of Systems: A full ROS was attempted today and was able to be performed.  Systems assessed include - Constitutional, Eyes, HENT, Respiratory, Cardiovascular, Gastrointestinal, Genitourinary, Integument/breast, Hematologic/lymphatic,  Musculoskeletal, Neurological, Behavioral/Psych, Endocrine, Allergic/Immunologic - with pertinent responses as per HPI.  Physical Examination: Weight:  [55 kg] 55 kg (03/04 1200)  General - well nourished, well developed, in no apparent distress.    Ophthalmologic - fundi not visualized due to noncooperation.    Cardiovascular - regular rhythm and rate  Mental Status -  Level of arousal and orientation to time, place, and person were intact. Language including expression, naming, repetition, comprehension, reading, and writing was assessed  and found intact. Fund of Knowledge was assessed and was intact.  Cranial Nerves II - XII - II - Vision intact OD.  Legally blind OS but able to see hand waving but not finger counting. III, IV, VI - Extraocular movements intact.  Left eye lateral position at rest V - Facial sensation intact bilaterally. VII - Facial movement intact bilaterally. VIII - Hearing & vestibular intact bilaterally. X - Palate elevates symmetrically. XI - Chin turning & shoulder shrug intact bilaterally. XII - Tongue protrusion intact.  Motor Strength - The patients strength was normal in all extremities and pronator drift was absent except slight clumsiness of right hand and right leg.   Motor Tone & Bulk - Muscle tone was assessed at the neck and appendages and was normal.  Bulk was normal and fasciculations were absent.   Reflexes - The patients reflexes were normal in all extremities and she had no pathological reflexes.  Sensory - Light touch, temperature/pinprick were assessed and were normal.    Coordination - The patient had normal movements in the hands and feet with no ataxia or dysmetria.  Tremor was absent.  Gait and Station - deferred  Data Reviewed: CT HEAD CODE STROKE WO CONTRAST  Result Date: 04/08/2021 CLINICAL DATA:  Code stroke. Neuro deficit, acute, stroke suspected. Right-sided weakness. EXAM: CT HEAD WITHOUT CONTRAST TECHNIQUE: Contiguous axial images were obtained from the base of the skull through the vertex without intravenous contrast. RADIATION DOSE REDUCTION: This exam was performed according to the departmental dose-optimization program which includes automated exposure control, adjustment of the mA and/or kV according to patient size and/or use of iterative reconstruction technique. COMPARISON:  No pertinent prior exams available for comparison. FINDINGS: Brain: Mild-to-moderate generalized cerebral and cerebellar atrophy. Mild ill-defined hypoattenuation within the periventricular  white matter, nonspecific but compatible chronic small vessel ischemic disease. There is no acute intracranial hemorrhage. No demarcated cortical infarct. No extra-axial fluid collection. No evidence of an intracranial mass. No midline shift. Vascular: No hyperdense vessel.  Atherosclerotic calcifications. Skull: Normal. Negative for fracture or focal lesion. Sinuses/Orbits: Visualized orbits show no acute finding. No significant paranasal sinus disease at the imaged levels. ASPECTS [facility] Stroke Program Early CT Score) - Ganglionic level infarction (caudate, lentiform nuclei, internal capsule, insula, M1-M3 cortex): 7 - Supraganglionic infarction (M4-M6 cortex): 3 Total score (0-10 with 10 being normal): 10 These results were communicated to Dr. Erlinda Hong At 12:33 pmon 3/4/2023by text page via the [facility] messaging system. IMPRESSION: No evidence of acute intracranial abnormality. Mild chronic small vessel given changes within the cerebral white matter. Mild-to-moderate generalized parenchymal atrophy. Electronically Signed   By: Kellie Simmering D.O.   On: 04/08/2021 12:33    Assessment: 77 y.o. 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  Symptoms not improved on EMS arrival and nearly resolved in ER.  Patient had 2 similar episodes within the last  3 months.  Time onset 11:45 AM.  NIH score 0 but with slight clumsiness right hand and right leg.  CT no acute finding.  Patient not candidate candidate given none disabling symptoms.  CT head and neck done showed likely chronic left ICA occlusion and likely acute left M2 occlusion.  However, patient not a IR candidate given none disabling symptoms and chronic left ICA occlusion.  Will need close neuro monitoring, if symptoms recurs or getting worse, may consider TNK if within window.  IV bolus and maintenance fluid.  Loaded with aspirin and the  Plavix.  Put on statin.  Stroke Risk Factors - hypertension and chronic left ICA occlusion  Plan: Recommend internal medicine admission for further stroke work up  Frequent neuro checks, if symptoms recur or getting worse, may still consider TNK if within window Telemetry monitoring MRI brain  Echocardiogram  UDS, fasting lipid panel and HgbA1C PT/OT/speech consult Permissive hypertension (only treat if BP > 220/120 unless a lower blood pressure is clinically necessary) for 24-48 hours post stroke onset GI and DVT prophylaxis  Loaded with aspirin 325 and plavix '300mg'$ .  Continue DAPT On Lipitor 40.  Continue for now Stroke risk factor modification Discussed with Dr. Francia Greaves ED physician We will follow  Thank you for this consultation and allowing [place] to participate in the care of this patient.  Rosalin Hawking, MD PhD Stroke Neurology 04/08/2021 5:16 PM

## 2021-04-08 NOTE — ED Notes (Signed)
Hospitalist and Neuro informed/paged regarding changes.  ?

## 2021-04-08 NOTE — H&P (Signed)
NEUROLOGY CONSULTATION PROGRESS NOTE   Date of service: April 08, 2021 Patient Name: Angela Jenkins MRN:  [id number] DOB:  1946/01/29  Brief HPI  Angela Jenkins is a 76 y.o. female with PMH significant for hypertension, left CRAO, left glaucoma and legally blind in the left eye who presented to the ED with right facial droop, right-sided weakness and aphasia.  Her symptoms had mostly resolved in the ED with just some mild clumsiness on the right.  She was seen around 1749 and was at her baseline.  Around 1820, patient was noted to have right facial droop and right-sided weakness along with aphasia.  The case was discussed with me by RN and a code stroke was immediately reactivated.  Repeat CTA with internal reconstitution of flow within the cervical left ICA.  However, the vessel remains diminutive.  There is eccentric hypodensity within the vessel lumen of the left ICA origin which may reflect soft plaque or thrombus resulting in severe focal stenosis at the site.  The intracranial left ICA demonstrated severe persistent narrowing of the petrous and the proximal cavernous segment with subtle linear ?thrombus in the distal cavernous left ICA.  The left MCA M1 is occluded shortly beyond its origin. CT perfusion demonstrates a core of 6 mL and a penumbra of 80 mL.  LKW: Probably 11:45 AM but 11 AM is a definite LSN mRS: 1 tNKASE: Not offered, outside the window at the time of my evaluation. Thrombectomy: Yes. Dr. Estanislado Pandy discussed with patient's husband the details along with risks and benefits of endovascular revascularization. Patient's husband consented to the procedure.  NIHSS components Score: Comment  1a Level of Conscious 0'[x]'$  1'[]'$  2'[]'$  3'[]'$      1b LOC Questions 0'[]'$  1'[]'$  2'[x]'$       1c LOC Commands 0'[x]'$  1'[]'$  2'[]'$       2 Best Gaze 0'[]'$  1'[x]'$  2'[]'$       3 Visual 0'[x]'$  1'[]'$  2'[]'$  3'[]'$      4 Facial Palsy 0'[]'$  1'[x]'$  2'[]'$  3'[]'$      5a Motor Arm - left 0'[x]'$  1'[]'$  2'[]'$  3'[]'$  4'[]'$  UN'[]'$    5b Motor Arm - Right 0'[]'$  1'[]'$  2'[]'$  3'[x]'$   4'[]'$  UN'[]'$    6a Motor Leg - Left 0'[x]'$  1'[]'$  2'[]'$  3'[]'$  4'[]'$  UN'[]'$    6b Motor Leg - Right 0'[]'$  1'[]'$  2'[x]'$  3'[]'$  4'[]'$  UN'[]'$    7 Limb Ataxia 0'[x]'$  1'[]'$  2'[]'$  3'[]'$  UN'[]'$     8 Sensory 0'[]'$  1'[x]'$  2'[]'$  UN'[]'$      9 Best Language 0'[x]'$  1'[]'$  2'[]'$  3'[]'$      10 Dysarthria 0'[x]'$  1'[]'$  2'[]'$  UN'[]'$      11 Extinct. and Inattention 0'[x]'$  1'[]'$  2'[]'$       TOTAL: 13      Vitals   Vitals:   04/08/21 1500 04/08/21 1659 04/08/21 1800 04/08/21 1915  BP: 134/72 (!) 142/82 (!) 155/127 (!) 146/76  Pulse: 86 99 90 93  Resp: '15 20 20 16  '$ Temp:  98.3 F (36.8 C)    TempSrc:  Oral    SpO2: 98% 98% 97% 98%  Weight:         Body mass index is 21.48 kg/m.  Physical Exam   General: Laying comfortably in bed; in no acute distress.  HENT: Normal oropharynx and mucosa. Normal external appearance of ears and nose.  Neck: Supple, no pain or tenderness  CV: No JVD. No peripheral edema.  Pulmonary: Symmetric Chest rise. Normal respiratory effort.  Abdomen: Soft to touch, non-tender.  Ext: No cyanosis, edema, or deformity  Skin: No rash. Normal palpation of  skin.   Musculoskeletal: Normal digits and nails by inspection. No clubbing.   Neurologic Examination  Mental status/Cognition: Alert, garbled speech when attempting to answer orientation questions. Speech/language: dysarthric speech with word salad. Speech is non fluent. Comprehension intact to simple commands. Cranial nerves:   CN II Pupils equal and reactive to light, no VF deficits    CN III,IV,VI EOM intact, L gaze preference, no nystagmus    CN V normal sensation in V1, V2, and V3 segments bilaterally   CN VII Mild flattening of the right nasolabial fold.   CN VIII Turns head towards speech   CN IX & X normal palatal elevation, no uvular deviation   CN XI    CN XII midline tongue protrusion   Motor:  Muscle bulk: poor, tone flaccid in RUE Mvmt Root Nerve  Muscle Right Left Comments  SA C5/6 Ax Deltoid 1 5   EF C5/6 Mc Biceps 1 5   EE C6/7/8 Rad Triceps 2 5   WF C6/7 Med FCR      WE C7/8 PIN ECU     F Ab C8/T1 U ADM/FDI 1 1   HF L1/2/3 Fem Illopsoas 3 5   KE L2/3/4 Fem Quad 3 5   DF L4/5 D Peron Tib Ant 4 5   PF S1/2 Tibial Grc/Sol 4 5    Sensation:  Light touch Decreased in LUE   Pin prick    Temperature    Vibration   Proprioception    Coordination/Complex Motor:  - Finger to Nose unable to do with RUE. Intact in LUE - Heel to shin unable to do - Rapid alternating movement unable to do with RUE - Gait: deferred for patient safety.  Labs   Basic Metabolic Panel:  Lab Results  Component Value Date   NA 133 (L) 04/08/2021   K 4.3 04/08/2021   CO2 22 04/08/2021   GLUCOSE 105 (H) 04/08/2021   BUN 14 04/08/2021   CREATININE 0.70 04/08/2021   CALCIUM 10.3 04/08/2021   GFRNONAA >60 04/08/2021   GFRAA >90 07/15/2011   HbA1c: No results found for: HGBA1C LDL: No results found for: [facility] Urine Drug Screen: No results found for: LABOPIA, COCAINSCRNUR, LABBENZ, AMPHETMU, THCU, LABBARB  Alcohol Level No results found for: ETH No results found for: PHENYTOIN, ZONISAMIDE, LAMOTRIGINE, LEVETIRACETA No results found for: PHENYTOIN, PHENOBARB, VALPROATE, CBMZ  Imaging and Diagnostic studies   CTH code stroke(personally reviewed): A punctate acute cortical infarct within the left parietal lobe was better appreciated on the brain MRI performed earlier today. No evidence of acute intracranial hemorrhage.   Chronic findings without appreciable interval change from the brain MRI performed earlier today, as described.   Mild mucosal thickening within the bilateral maxillary sinuses.  CTA neck(personally reviewed):   Since the CTA performed earlier today, there has been interval reconstitution of flow within the cervical left ICA. However, this vessel remains diminutive. Additionally, there is eccentric hypodensity within the vessel lumen at the left ICA origin (which may reflect soft plaque or thrombus) resulting in severe focal stenosis at this  site.   CTA head(personally reviewed):   The intracranial left internal carotid artery is now patent. However, there is persistent severe narrowing of the petrous and proximal cavernous segments. New from the prior exam, subtle linear thrombus is questioned within the distal cavernous left ICA. 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   CT perfusion head(personally reviewed):  The perfusion software identifies core infarcts within the left MCA vascular territory totaling 6 mL. The perfusion software identifies critically hypoperfused parenchyma within the left MCA vascular territory totaling 86 mL (utilizing the Tmax>6 seconds threshold). Reported mismatch volume: 80 mL.   MRI Brain without contrast: Punctate acute cortical infarct within the left parietal lobe (left MCA vascular territory).   Small chronic cortically-based infarct within the lateral left frontoparietal lobes (left MCA vascular territory).   Mild chronic small vessel ischemic changes within the cerebral white matter.   Signal abnormality within the petrous and proximal cavernous segments of the left ICA, at site of known vessel occlusion.   Focus of susceptibility-weighted signal loss within the left sylvian fissure, likely reflecting thrombus within an occluded proximal M2 left MCA vessel (as was demonstrated on the CTA head performed earlier today).   Mild-to-moderate generalized cerebral and cerebellar atrophy.   Paranasal sinus disease, as described.   Impression   Angela Jenkins is a 76 y.o. 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 Symptoms nearly resolved in the ED. She had recurrence of her deficit and thus repeat CTH, CTA and CTP obtained which shows a thrombus in L MCA M1 along with a 71m core and 860m penumbra in the L MCA distribution.  Suspect that she developed a thrombus on top of a soft plaque in the L ICA which embolized distally into the L MCA.  Recommendations   Acute L MCA stroke: - Frequent Neuro checks per stroke unit protocol - Recommend brain imaging with MRI Brain without contrast - Recommend obtaining Lipid panel with LDL - Please start statin if LDL > 70 - Recommend HbA1c - Antithrombotic - per Neuro IR for the first 24 hours after intervention. - Recommend DVT ppx - SBP goal - per Neuro IR for the first 24 hours after intervention. - Recommend Telemetry monitoring for arrythmia - Recommend bedside swallow screen prior to PO intake. - Stroke education booklet - Recommend PT/OT/SLP consult  Primary hypertension Hold home meds given stroke. Goal SBP per Neuro IR for the first 24 hours after intervention.   Hyperlipidemia LDL pending   Glaucoma Continue drops    _____________________________________________________________________  This patient is critically ill and at significant risk of neurological worsening, death and care requires constant monitoring of vital signs, hemodynamics,respiratory and cardiac monitoring, neurological assessment, discussion with family, other specialists and medical decision making of high complexity. I spent 50 minutes of neurocritical care time  in the care of  this patient. This was time spent independent of any time provided by nurse practitioner or PA.  [facility]net[facility] Pager Number [phone]/05/2021  8:09 PM    Thank you for the opportunity to take part in the care of this patient. If you have any further questions, please contact the neurology consultation attending.  Signed,  SaChillumager Number [phone]

## 2021-04-08 NOTE — Assessment & Plan Note (Signed)
Started on lipitor '40mg'$  today ?Fasting lipid panel in AM  ?

## 2021-04-08 NOTE — Consult Note (Signed)
? ?NAMETerra Jenkins, MRN:  [id number], DOB:  11-27-1945, LOS: 0 ?ADMISSION DATE:  04/08/2021, CONSULTATION DATE:  3/4 ?REFERRING MD:  Estanislado Pandy , CHIEF COMPLAINT:  Vent MGMT  ? ?History of Present Illness:  ?Patient is encephalopathic and/or intubated. Therefore history has been obtained from chart review.  ? ? ?Angela Jenkins is a 76 y.o. female who is seen in consultation at the request of Dr. Estanislado Pandy for management of mechanical ventilation.  ?  ?They have a pertinent past medical history of hypertension and glaucoma ? ?She presented to the [facility] ED as a code stroke, presenting after an episode of aphasia, right facial droop, right-sided weakness.  Last known well 11 AM.  Possible acute left M2 occlusion. She was not given tPA, due to nondisabling minimal deficit. She was seen by neurology and admitted to the hospitalist service. ? ?At 1820 on 3/4 she was found to have a new right facial droop, right-sided weakness, and aphasia.  Code stroke was activated.  She was found to have an left MCA stroke. She was taken to neuro IR.  She remained intubated postprocedure ? ?PCCM was consulted for vent management ? ? ?Pertinent  Medical History  ?hypertension and glaucoma ? ?Significant Hospital Events: ?Including procedures, antibiotic start and stop dates in addition to other pertinent events   ?3/4 Admit, Code stroke, NIR ? ?Interim History / Subjective:  ?See above  ? ?Intubated/sedated ? ?Unable to obtain subjective evaluation due to patient status ? ?Objective   ?Blood pressure (!) 146/76, pulse 93, temperature 98.3 ?F (36.8 ?C), temperature source Oral, resp. rate 16, weight 55 kg, SpO2 98 %. ?   ?   ?No intake or output data in the 24 hours ending 04/08/21 2200 ?Filed Weights  ? 04/08/21 1200  ?Weight: 55 kg  ? ? ?Examination: ?General: In bed, NAD, appears comfortable ?HEENT: MM pink/moist, anicteric, atraumatic ?Neuro:  PERRL 64m, sedated  ?CV: S1S2, NSR, no m/r/g appreciated ?PULM:  clear in the upper lobes, clear  in the lower lobes, trachea midline, chest expansion symmetric ?GI: soft, bsx4 active, non-tender   ?Extremities: warm/dry, no pretibial edema, capillary refill less than 3 seconds  ?Skin:  no rashes or lesions noted ? ?Pertinent labs and imaging: ?NA 134 ?COVID and flu negative ?Hemoglobin 15.9 ?ABG 7.37/39/418/22 ?CXR: ETT in stable position. No pneumo, infiltrate, or effusion ? ?Resolved Hospital Problem list   ? ? ?Assessment & Plan:  ?Acute left MCA M1 stroke, S/P NIR with TICI 3 revascularization ?Carotid artery stenosis, left, S/P NIR balloon angioplasty, 70% patency post procedure ?Presented as code stroke on 3/4. CT scan postintervention shows contrast blush in left putamen per Dr. DEstanislado Pandy ?P ?-Management and work up per neurology/NIR/primary ?-Goal SBP 120-140. Cleviprex ordered. titrate medicine to goal blood pressure ?-ASA, plavix, statin ?-Follow up ECHO ?-Follow up LDL panel ?-Follow up ultrasounds ?-Follow up MRI and CT head  ?-Continue neuro watch ?-Continue neuroprotective measures: normothermia, euglycemia, HOB greater than 30 when able post procedure, head in neutral alignment, normocapnia, normoxia.  ?-Trend CBC and BMET ?-Aspirations precautions  ? ?Endotracheally intubated ?Left intubated post NIR procedure ?P ?-LTVV strategy with tidal volumes of 4-8 cc/kg ideal body weight ?-Goal plateau pressures less than 30 and driving pressures less than 15 ?-Wean PEEP/FiO2 for SpO2 92-98% ?-VAP bundle ?-Daily SAT and SBT. Plan to extubate in the AM ?-PAD bundle with Propofol gtt and fentanyl push ?-Follow intermittent CXR and ABG PRN ?-Obtain CXR now and ABG 1 hour post arrival to ICU ? ?  HTN ?HLD ?-Hold home antihypertensives ?-Statin per primary ? ?Glacuoma ?-Continue eyedrops ? ?Best Practice (right click and "Reselect all SmartList Selections" daily)  ? ?Diet/type: NPO w/ meds via tube ?DVT prophylaxis: SCD ?GI prophylaxis: PPI ?Lines: N/A ?Foley:  N/A ?Code Status:  full code ?Last date of  multidisciplinary goals of care discussion [pending] ? ?Labs   ?CBC: ?Recent Labs  ?Lab 04/08/21 ?1214 04/08/21 ?1230  ?WBC 8.2  --   ?NEUTROABS 5.5  --   ?HGB 15.9* 16.7*  ?HCT 45.8 49.0*  ?MCV 98.7  --   ?PLT 317  --   ? ? ?Basic Metabolic Panel: ?Recent Labs  ?Lab 04/08/21 ?1214 04/08/21 ?1230  ?NA 134* 133*  ?K 4.3 4.3  ?CL 97* 100  ?CO2 22  --   ?GLUCOSE 110* 105*  ?BUN 12 14  ?CREATININE 0.73 0.70  ?CALCIUM 10.3  --   ? ?GFR: ?CrCl cannot be calculated (Unknown ideal weight.). ?Recent Labs  ?Lab 04/08/21 ?1214  ?WBC 8.2  ? ? ?Liver Function Tests: ?Recent Labs  ?Lab 04/08/21 ?1214  ?AST 22  ?ALT 16  ?ALKPHOS 54  ?BILITOT 0.6  ?PROT 7.5  ?ALBUMIN 4.4  ? ?No results for input(s): LIPASE, AMYLASE in the last 168 hours. ?No results for input(s): AMMONIA in the last 168 hours. ? ?ABG ?   ?Component Value Date/Time  ? TCO2 23 04/08/2021 1230  ?  ? ?Coagulation Profile: ?Recent Labs  ?Lab 04/08/21 ?1214  ?INR 1.0  ? ? ?Cardiac Enzymes: ?No results for input(s): CKTOTAL, CKMB, CKMBINDEX, TROPONINI in the last 168 hours. ? ?HbA1C: ?No results found for: HGBA1C ? ?CBG: ?Recent Labs  ?Lab 04/08/21 ?1215 04/08/21 ?1904  ?GLUCAP 97 103*  ? ? ?Review of Systems:   ?Unable to obtain ROS ? ?Past Medical History:  ?She,  has a past medical history of Glaucoma and Hypertension.  ? ?Surgical History:  ? ?Past Surgical History:  ?Procedure Laterality Date  ? EXTERNAL EAR SURGERY    ? TONSILLECTOMY    ?  ? ?Social History:  ? reports that she has quit smoking. She does not have any smokeless tobacco history on file. She reports current alcohol use. She reports that she does not use drugs.  ? ?Family History:  ?Her family history includes Heart attack in her mother; Hypertension in her brother; Stroke in her father.  ? ?Allergies ?Allergies  ?Allergen Reactions  ? Brimonidine Other (See Comments) and Cough  ?  Dry mouth, dizziness, very sleepy  ?  ? ?Home Medications  ?Prior to Admission medications   ?Medication Sig Start Date End  Date Taking? Authorizing Provider  ?amLODipine (NORVASC) 5 MG tablet Take 5 mg by mouth daily. 02/01/21  Yes [provider]  ?denosumab (PROLIA) 60 MG/ML SOSY injection Inject 60 mg into the skin every 6 (six) months.   Yes [provider]  ?dorzolamide-timolol (COSOPT) 22.3-6.8 MG/ML ophthalmic solution Place 1 drop into both eyes 2 (two) times daily.   Yes [provider]  ?hydrochlorothiazide (HYDRODIURIL) 12.5 MG tablet Take 12.5 mg by mouth daily. 02/01/21  Yes [provider]  ?latanoprost (XALATAN) 0.005 % ophthalmic solution Place 1 drop into both eyes at bedtime.   Yes [provider]  ?losartan (COZAAR) 100 MG tablet Take 100 mg by mouth daily. 03/30/21  Yes [provider]  ?VITAMIN D PO Take 20,000 Units by mouth once a week. Wednesdays   Yes [provider]  ?  ? ?Critical care time: 34 minutes ?  ? ?  Redmond School., MSN, APRN, AGACNP-BC ?[facility]  ?04/08/2021 , 10:25 PM ? ?Please see [email or web address] for pager details ? ?If no response, please call [phone] ?After hours, please call Elink at [phone] ? ? ? ?

## 2021-04-09 ENCOUNTER — Encounter (HOSPITAL_COMMUNITY): Payer: Self-pay | Admitting: Neurology

## 2021-04-09 ENCOUNTER — Inpatient Hospital Stay (HOSPITAL_COMMUNITY): Payer: Medicare HMO

## 2021-04-09 DIAGNOSIS — I6522 Occlusion and stenosis of left carotid artery: Secondary | ICD-10-CM | POA: Diagnosis not present

## 2021-04-09 DIAGNOSIS — I63412 Cerebral infarction due to embolism of left middle cerebral artery: Secondary | ICD-10-CM | POA: Diagnosis not present

## 2021-04-09 DIAGNOSIS — I6602 Occlusion and stenosis of left middle cerebral artery: Secondary | ICD-10-CM

## 2021-04-09 DIAGNOSIS — I63512 Cerebral infarction due to unspecified occlusion or stenosis of left middle cerebral artery: Secondary | ICD-10-CM | POA: Diagnosis not present

## 2021-04-09 DIAGNOSIS — E785 Hyperlipidemia, unspecified: Secondary | ICD-10-CM | POA: Diagnosis not present

## 2021-04-09 DIAGNOSIS — J9601 Acute respiratory failure with hypoxia: Secondary | ICD-10-CM

## 2021-04-09 LAB — LIPID PANEL
Cholesterol: 215 mg/dL — ABNORMAL HIGH (ref 0–200)
HDL: 82 mg/dL (ref >40)
LDL Cholesterol: 103 mg/dL — ABNORMAL HIGH (ref 0–99)
Total CHOL/HDL Ratio: 2.6 RATIO
Triglycerides: 149 mg/dL (ref <150)
VLDL: 30 mg/dL (ref 0–40)

## 2021-04-09 LAB — BASIC METABOLIC PANEL
Anion gap: 11 (ref 5–15)
BUN: 6 mg/dL — ABNORMAL LOW (ref 8–23)
CO2: 23 mmol/L (ref 22–32)
Calcium: 8.1 mg/dL — ABNORMAL LOW (ref 8.9–10.3)
Chloride: 102 mmol/L (ref 98–111)
Creatinine, Ser: 0.61 mg/dL (ref 0.44–1.00)
GFR, Estimated: >60 mL/min (ref >60)
Glucose, Bld: 126 mg/dL — ABNORMAL HIGH (ref 70–99)
Potassium: 3.4 mmol/L — ABNORMAL LOW (ref 3.5–5.1)
Sodium: 136 mmol/L (ref 135–145)

## 2021-04-09 LAB — CBC
HCT: 37.9 % (ref 36.0–46.0)
Hemoglobin: 13.4 g/dL (ref 12.0–15.0)
MCH: 34.7 pg — ABNORMAL HIGH (ref 26.0–34.0)
MCHC: 35.4 g/dL (ref 30.0–36.0)
MCV: 98.2 fL (ref 80.0–100.0)
Platelets: 325 10*3/uL (ref 150–400)
RBC: 3.86 MIL/uL — ABNORMAL LOW (ref 3.87–5.11)
RDW: 12.6 % (ref 11.5–15.5)
WBC: 14.2 10*3/uL — ABNORMAL HIGH (ref 4.0–10.5)
nRBC: 0 % (ref 0.0–0.2)

## 2021-04-09 LAB — HEMOGLOBIN A1C
Hgb A1c MFr Bld: 5.5 % (ref 4.8–5.6)
Mean Plasma Glucose: 111.15 mg/dL

## 2021-04-09 LAB — MRSA NEXT GEN BY PCR, NASAL: MRSA by PCR Next Gen: NOT DETECTED

## 2021-04-09 MED ORDER — POTASSIUM CHLORIDE 10 MEQ/100ML IV SOLN
10.0000 meq | INTRAVENOUS | Status: AC
  Administered 2021-04-09 (×2): 10 meq via INTRAVENOUS
  Filled 2021-04-09: qty 100

## 2021-04-09 MED ORDER — PROPOFOL 1000 MG/100ML IV EMUL
INTRAVENOUS | Status: AC
  Filled 2021-04-09: qty 100

## 2021-04-09 MED ORDER — PANTOPRAZOLE 2 MG/ML SUSPENSION
40.0000 mg | Freq: Every day | ORAL | Status: DC
  Administered 2021-04-09: 40 mg
  Filled 2021-04-09: qty 20

## 2021-04-09 MED ORDER — ATORVASTATIN CALCIUM 40 MG PO TABS
40.0000 mg | ORAL_TABLET | Freq: Every day | ORAL | Status: DC
  Administered 2021-04-09 – 2021-04-10 (×2): 40 mg
  Filled 2021-04-09 (×3): qty 1

## 2021-04-09 NOTE — Progress Notes (Signed)
SLP Cancellation Note ? ?Patient Details ?Name: Angela Jenkins ?MRN: [id number] ?DOB: [birth year] ? ? ?Cancelled treatment:        Received order for speech-lang-cog eval. Pt ventilated mechanically. Will follow. ? ? ?Houston Siren ?04/09/2021, 8:05 AM ?

## 2021-04-09 NOTE — Progress Notes (Signed)
Pt was transported to MRI via ventilator with no apparent complications. Pt is now back in 4N 28 and is currently stable. ?

## 2021-04-09 NOTE — Progress Notes (Signed)
? ?NAMEBrynja Jenkins, MRN:  [id number], DOB:  10-Mar-1945, LOS: 1 ?ADMISSION DATE:  04/08/2021, CONSULTATION DATE:  3/4 ?REFERRING MD:  Lorrin Goodell, CHIEF COMPLAINT:  right sided weakness  ? ?History of Present Illness:  ?76 y/o female presented with right sided weakness and aphasia on 3/4. Symptoms initially improved in the ER, the patient was admitted but then symptoms worsened while the patient was holding for admission in the ER.   ? ?Pertinent  Medical History  ?Glaucoma ?Hypertension ? ?Significant Hospital Events: ?Including procedures, antibiotic start and stop dates in addition to other pertinent events   ?3/4 presented for right sided facial droop and weakness in morning, symptoms resolved spontaneously then recurred while holding for admission in the ER.  Initial CT angiogram showed occlusion in the left ICA, prox M2 left MCA. MRI brain then showed left parietal lobe cortical infarct, mild chornic small vessel ischemic changes in cerebral white matter, thrombus in M2 left MCA. Repeat CTA showed interval reconstitution of flow in left cervical ICA, though the vessel is diminutive. 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  Went to IR for left MCA thrombus removal and balloon angioplasty of L ICA, post CT showed subarachnoid hyper attenuation  ? ?Interim History / Subjective:  ? ?Intubated for thrombectomy ?Following commands on mechanical ventilation this morning ?No acute events otherwise since IR  ? ?Objective   ?Blood pressure 119/64, pulse 78, temperature 98.3 ?F (36.8 ?C), temperature source Oral, resp. rate 14, height '5\' 2"'$  (1.575 m), weight 55.8 kg, SpO2 99 %. ?   ?Vent Mode: PRVC ?FiO2 (%):  [30 %-100 %] 30 % ?Set Rate:  [14 bmp] 14 bmp ?Vt Set:  [400 mL] 400 mL ?PEEP:  [5 cmH20] 5 cmH20 ?Plateau Pressure:  [10 cmH20-14 cmH20] 10 cmH20  ? ?Intake/Output Summary (Last 24 hours) at 04/09/2021 0725 ?Last data filed at 04/09/2021  0600 ?Gross per 24 hour  ?Intake 2464.93 ml  ?Output 1900 ml  ?Net 564.93 ml  ? ?Filed Weights  ? 04/08/21 1200 04/08/21 2210  ?Weight: 55 kg 55.8 kg  ? ? ?Examination: ?General:  In bed on vent ?HENT: NCAT ETT in place ?PULM: CTA B, vent supported breathing ?CV: RRR, no mgr ?GI: BS+, soft, nontender ?MSK: normal bulk and tone ?Neuro: sedated on vent ? ? ?Resolved Hospital Problem list   ? ? ?Assessment & Plan:  ?Acute CVA left MCA and ICA occlusion, s/p thrombectomy ?Concern for [facility] post thrombectomy ?Secondary stroke prevention per neurology ?Repeat head imaging per neurology/neuro IR ?Goal SBP 120-140 per neuro IR ?PT/OT/SLP ? ?Acute respiratory failure with hypoxemia due to inability to protect airway > improving ?SBT now ?Plan extubation after MRI brain ?NPO after extubation until SLP eval ? ?Hypertension ?Continue cleviprex per IR recs ? ? ?Best Practice (right click and "Reselect all SmartList Selections" daily)  ? ?Diet/type: NPO ?DVT prophylaxis: SCD ?GI prophylaxis: PPI ?Lines: N/A ?Foley:  N/A ?Code Status:  full code ?Last date of multidisciplinary goals of care discussion [husband updated bedside] ? ?Labs   ?CBC: ?Recent Labs  ?Lab 04/08/21 ?1214 04/08/21 ?1230 04/08/21 ?2228 04/08/21 ?2229 04/09/21 ?3154  ?WBC 8.2  --   --  10.4 14.2*  ?NEUTROABS 5.5  --   --   --   --   ?HGB 15.9* 16.7* 13.9 15.0 13.4  ?HCT 45.8 49.0* 41.0 43.0 37.9  ?MCV 98.7  --   --  98.4 98.2  ?  PLT 317  --   --  326 325  ? ? ?Basic Metabolic Panel: ?Recent Labs  ?Lab 04/08/21 ?1214 04/08/21 ?1230 04/08/21 ?2228 04/08/21 ?2229 04/09/21 ?4193  ?NA 134* 133* 135  --  136  ?K 4.3 4.3 3.0*  --  3.4*  ?CL 97* 100  --   --  102  ?CO2 22  --   --   --  23  ?GLUCOSE 110* 105*  --   --  126*  ?BUN 12 14  --   --  6*  ?CREATININE 0.73 0.70  --  0.51 0.61  ?CALCIUM 10.3  --   --   --  8.1*  ? ?GFR: ?Estimated Creatinine Clearance: 48.1 mL/min (by C-G formula based on SCr of 0.61 mg/dL). ?Recent Labs  ?Lab 04/08/21 ?1214 04/08/21 ?2229  04/09/21 ?7902  ?WBC 8.2 10.4 14.2*  ? ? ?Liver Function Tests: ?Recent Labs  ?Lab 04/08/21 ?1214  ?AST 22  ?ALT 16  ?ALKPHOS 54  ?BILITOT 0.6  ?PROT 7.5  ?ALBUMIN 4.4  ? ?No results for input(s): LIPASE, AMYLASE in the last 168 hours. ?No results for input(s): AMMONIA in the last 168 hours. ? ?ABG ?   ?Component Value Date/Time  ? PHART 7.373 04/08/2021 2228  ? PCO2ART 39.3 04/08/2021 2228  ? PO2ART 418 (H) 04/08/2021 2228  ? HCO3 22.9 04/08/2021 2228  ? TCO2 24 04/08/2021 2228  ? ACIDBASEDEF 2.0 04/08/2021 2228  ? O2SAT 100 04/08/2021 2228  ?  ? ?Coagulation Profile: ?Recent Labs  ?Lab 04/08/21 ?1214  ?INR 1.0  ? ? ?Cardiac Enzymes: ?No results for input(s): CKTOTAL, CKMB, CKMBINDEX, TROPONINI in the last 168 hours. ? ?HbA1C: ?Hgb A1c MFr Bld  ?Date/Time Value Ref Range Status  ?04/09/2021 05:29 AM 5.5 4.8 - 5.6 % Final  ?  Comment:  ?  (NOTE) ?Pre diabetes:          5.7%-6.4% ? ?Diabetes:              >6.4% ? ?Glycemic control for   <7.0% ?adults with diabetes ?  ? ? ?CBG: ?Recent Labs  ?Lab 04/08/21 ?1215 04/08/21 ?1904  ?GLUCAP 97 103*  ? ? ?Critical care time: 32 minutes ?  ? ? ?Roselie Awkward, MD ?[facility] ?Pager: [phone] ?Cell: [phone] ?After 7:00 pm call Elink  [phone] ? ? ?

## 2021-04-09 NOTE — Progress Notes (Signed)
OT Cancellation Note ? ?Patient Details ?Name: Angela Jenkins ?MRN: [id number] ?DOB: 1945/08/14 ? ? ?Cancelled Treatment:    Reason Eval/Treat Not Completed: Active bedrest order (Will assess when activity orders updated. Thanks) ? ?Amadi Yoshino,HILLARY ?[facility], OT/L  ? ?Acute OT Clinical Specialist ?Acute Rehabilitation Services ?Pager [phone] ?Office [phone]  ?04/09/2021, 7:57 AM ?

## 2021-04-09 NOTE — Progress Notes (Addendum)
STROKE TEAM PROGRESS NOTE   INTERVAL HISTORY Her son is at the bedside.  Patient is awake and alert, with ET tube in place, with plans to extubate after evaluation. Will plan for carotid studies likely Monday.  Vitals:   04/09/21 1130 04/09/21 1200 04/09/21 1300 04/09/21 1400  BP: 118/63 122/61 (!) 104/53 (!) 107/53  Pulse: 84 80 74 69  Resp: '12 16 16 15  '$ Temp:      TempSrc:      SpO2: 100% 99% 100% 100%  Weight:      Height:       CBC:  Recent Labs  Lab 04/08/21 1214 04/08/21 1230 04/08/21 2229 04/09/21 0529  WBC 8.2  --  10.4 14.2*  NEUTROABS 5.5  --   --   --   HGB 15.9*   < > 15.0 13.4  HCT 45.8   < > 43.0 37.9  MCV 98.7  --  98.4 98.2  PLT 317  --  326 325   < > = values in this interval not displayed.   Basic Metabolic Panel:  Recent Labs  Lab 04/08/21 1214 04/08/21 1230 04/08/21 2228 04/08/21 2229 04/09/21 0529  NA 134* 133* 135  --  136  K 4.3 4.3 3.0*  --  3.4*  CL 97* 100  --   --  102  CO2 22  --   --   --  23  GLUCOSE 110* 105*  --   --  126*  BUN 12 14  --   --  6*  CREATININE 0.73 0.70  --  0.51 0.61  CALCIUM 10.3  --   --   --  8.1*   Lipid Panel:  Recent Labs  Lab 04/09/21 0529  CHOL 215*  TRIG 149  HDL 82  CHOLHDL 2.6  VLDL 30  LDLCALC 103*    HgbA1c:  Recent Labs  Lab 04/09/21 0529  HGBA1C 5.5    Urine Drug Screen: No results for input(s): LABOPIA, COCAINSCRNUR, LABBENZ, AMPHETMU, THCU, LABBARB in the last 168 hours.  Alcohol Level No results for input(s): ETH in the last 168 hours.  IMAGING past 24 hours MR BRAIN WO CONTRAST  Addendum Date: 04/09/2021   ADDENDUM REPORT: 04/09/2021 11:36 ADDENDUM: These results were called by telephone at the time of interpretation on 04/09/2021 at 11:36 am to provider Dr. Erlinda Hong, who verbally acknowledged these results. Electronically Signed   By: Kellie Simmering D.O.   On: 04/09/2021 11:36   Result Date: 04/09/2021 CLINICAL DATA:  Provided history: Neuro deficit, acute, stroke suspected. EXAM: MRI HEAD  WITHOUT CONTRAST TECHNIQUE: Multiplanar, multiecho pulse sequences of the brain and surrounding structures were obtained without intravenous contrast. COMPARISON:  Prior CTA head/neck examinations 04/08/2021, prior noncontrast head CT examinations 04/08/2021, prior brain MRI 04/08/2021. Postprocedural head CT performed yesterday. FINDINGS: Brain: Mild-to-moderate generalized cerebral and cerebellar atrophy. New from the prior brain MRI of 04/08/2021, there are acute infarcts involving much of the left caudate and lentiform nuclei, and likely small portions of the left internal capsule. Slight regional mass effect without significant ventricular effacement or midline shift. Mild petechial hemorrhage within the left basal ganglia. There are also new small cortically based infarcts within the left frontal and parietal lobes (left MCA vascular territory). There are a few punctate foci of petechial hemorrhage within the high left parietal lobe, also new from the prior MRI. There is T2 FLAIR hyperintense signal abnormality and susceptibility-weighted signal loss along portions of the left frontal, parietal and temporal lobes,  compatible with small-volume acute subarachnoid hemorrhage. Redemonstrated small chronic cortical infarct within the lateral left frontoparietal lobes. Mild multifocal T2 FLAIR hyperintense signal abnormality within the cerebral white matter, nonspecific but compatible with chronic small vessel ischemic disease. No evidence of an intracranial mass. No evidence of hydrocephalus. Vascular: Maintained flow voids within the proximal large arterial vessels. Skull and upper cervical spine: No focal suspicious marrow lesion. Sinuses/Orbits: Visualized orbits show no acute finding. Prior right ocular lens replacement. Mild mucosal thickening within the bilateral ethmoid, right sphenoid and bilateral maxillary sinuses. Small mucous retention cyst within the left sphenoid sinus. IMPRESSION: New from the prior  brain MRI of 04/08/2021, there are acute infarcts involving much of the left caudate and lentiform nuclei, as well as small portions of the left internal capsule. Slight regional mass effect without ventricular effacement or midline shift. Mild petechial hemorrhage within the left basal ganglia. There are several small acute cortically-based infarcts within the left frontal and parietal lobes, which are also new from the prior MRI (left MCA vascular territory). Additionally, there are a few new punctate foci of petechial hemorrhage within the high left parietal lobe. Signal abnormality along portions of the left frontal, parietal and temporal lobes, compatible with small-volume acute subarachnoid hemorrhage. Chronic findings without interval change, as described. Paranasal sinus disease, as outlined. Electronically Signed: By: Kellie Simmering D.O. On: 04/09/2021 11:31   MR BRAIN WO CONTRAST  Result Date: 04/08/2021 CLINICAL DATA:  Provided history: Neuro deficit, acute, stroke suspected. EXAM: MRI HEAD WITHOUT CONTRAST TECHNIQUE: Multiplanar, multiecho pulse sequences of the brain and surrounding structures were obtained without intravenous contrast. COMPARISON:  CT angiogram head/neck and non-contrast head CT performed earlier today 04/08/2021. FINDINGS: Brain: Mild-to-moderate generalized cerebral and cerebellar atrophy. Punctate acute or cortical infarct within the left parietal lobe (series 2, image 41 (left MCA vascular territory). Small chronic cortical infarct within the lateral left frontoparietal lobes (series 6, image 26). Mild multifocal T2 FLAIR hyperintense signal abnormality within the cerebral white matter, nonspecific but compatible with chronic small vessel ischemic disease. No evidence of an intracranial mass. No chronic intracranial blood products. No extra-axial fluid collection. No midline shift. Vascular: Signal abnormality within the petrous and proximal cavernous left ICA at site of known  vessel occlusion. Focus of susceptibility weighted signal loss within the left sylvian fissure, likely reflecting thrombus within an occluded proximal M2 left MCA vessel (series 7, image 53). Skull and upper cervical spine: No focal suspicious marrow lesion. Sinuses/Orbits: Visualized orbits show no acute finding. Prior left ocular lens replacement. Mild mucosal thickening within the bilateral maxillary sinuses. Trace mucosal thickening within the bilateral ethmoid sinuses. IMPRESSION: Punctate acute cortical infarct within the left parietal lobe (left MCA vascular territory). Small chronic cortically-based infarct within the lateral left frontoparietal lobes (left MCA vascular territory). Mild chronic small vessel ischemic changes within the cerebral white matter. Signal abnormality within the petrous and proximal cavernous segments of the left ICA, at site of known vessel occlusion. Focus of susceptibility-weighted signal loss within the left sylvian fissure, likely reflecting thrombus within an occluded proximal M2 left MCA vessel (as was demonstrated on the CTA head performed earlier today). Mild-to-moderate generalized cerebral and cerebellar atrophy. Paranasal sinus disease, as described. Electronically Signed   By: Kellie Simmering D.O.   On: 04/08/2021 16:19   Portable Chest x-ray  Result Date: 04/08/2021 CLINICAL DATA:  Endotracheal tube placement. EXAM: PORTABLE CHEST 1 VIEW COMPARISON:  07/15/2011. FINDINGS: The heart size and mediastinal contours are within normal limits. No  consolidation, effusion, or pneumothorax. An endotracheal tube terminates 3.1 cm above the carina. An enteric tube terminates in the stomach. Multiple old rib fractures are noted on the right with bone callus formation. IMPRESSION: 1. No active disease. 2. Endotracheal and enteric tubes as described above. Electronically Signed   By: Brett Fairy M.D.   On: 04/08/2021 23:07   ECHOCARDIOGRAM COMPLETE  Result Date: 04/08/2021     ECHOCARDIOGRAM REPORT   Patient Name:   CHABELI BARSAMIAN Date of Exam: 04/08/2021 Medical Rec #:  [id number]   Height:       63.0 in Accession #:    [id number]  Weight:       121.3 lb Date of Birth:  Feb 28, 1945   BSA:          1.563 m Patient Age:    39 years    BP:           134/72 mmHg Patient Gender: F           HR:           87 bpm. Exam Location:  Inpatient Procedure: 2D Echo, Cardiac Doppler and Color Doppler Indications:    Stroke  History:        Patient has no prior history of Echocardiogram examinations.  Sonographer:    Merrie Roof RDCS Referring Phys: [id number] [place]  1. Left ventricular ejection fraction, by estimation, is 60 to 65%. The left ventricle has normal function. The left ventricle has no regional wall motion abnormalities. Left ventricular diastolic parameters were normal.  2. Right ventricular systolic function is normal. The right ventricular size is normal. There is normal pulmonary artery systolic pressure.  3. The mitral valve is normal in structure. No evidence of mitral valve regurgitation. No evidence of mitral stenosis.  4. The aortic valve is normal in structure. Aortic valve regurgitation is not visualized. No aortic stenosis is present. FINDINGS  Left Ventricle: Left ventricular ejection fraction, by estimation, is 60 to 65%. The left ventricle has normal function. The left ventricle has no regional wall motion abnormalities. The left ventricular internal cavity size was normal in size. There is  no left ventricular hypertrophy. Left ventricular diastolic parameters were normal. Right Ventricle: The right ventricular size is normal. Right vetricular wall thickness was not well visualized. Right ventricular systolic function is normal. There is normal pulmonary artery systolic pressure. The tricuspid regurgitant velocity is 2.17 m/s, and with an assumed right atrial pressure of 3 mmHg, the estimated right ventricular systolic pressure is 42.7 mmHg. Left Atrium: Left  atrial size was normal in size. Right Atrium: Right atrial size was normal in size. Pericardium: There is no evidence of pericardial effusion. Mitral Valve: The mitral valve is normal in structure. No evidence of mitral valve regurgitation. No evidence of mitral valve stenosis. Tricuspid Valve: The tricuspid valve is normal in structure. Tricuspid valve regurgitation is trivial. Aortic Valve: The aortic valve is normal in structure. Aortic valve regurgitation is not visualized. No aortic stenosis is present. Aortic valve mean gradient measures 3.0 mmHg. Aortic valve peak gradient measures 6.2 mmHg. Aortic valve area, by VTI measures 1.78 cm. Pulmonic Valve: The pulmonic valve was not well visualized. Pulmonic valve regurgitation is not visualized. Aorta: The aortic root and ascending aorta are structurally normal, with no evidence of dilitation. IAS/Shunts: The atrial septum is grossly normal.  LEFT VENTRICLE PLAX 2D LVIDd:         4.00 cm   Diastology LVIDs:  2.70 cm   LV e' medial:    5.66 cm/s LV PW:         0.90 cm   LV E/e' medial:  11.4 LV IVS:        1.00 cm   LV e' lateral:   7.07 cm/s LVOT diam:     1.70 cm   LV E/e' lateral: 9.1 LV SV:         45 LV SV Index:   29 LVOT Area:     2.27 cm  RIGHT VENTRICLE RV Basal diam:  3.10 cm LEFT ATRIUM             Index        RIGHT ATRIUM          Index LA diam:        2.70 cm 1.73 cm/m   RA Area:     9.96 cm LA Vol (A2C):   40.1 ml 25.65 ml/m  RA Volume:   18.70 ml 11.96 ml/m LA Vol (A4C):   34.5 ml 22.07 ml/m LA Biplane Vol: 37.7 ml 24.12 ml/m  AORTIC VALVE AV Area (Vmax):    1.76 cm AV Area (Vmean):   1.73 cm AV Area (VTI):     1.78 cm AV Vmax:           124.00 cm/s AV Vmean:          87.200 cm/s AV VTI:            0.254 m AV Peak Grad:      6.2 mmHg AV Mean Grad:      3.0 mmHg LVOT Vmax:         96.00 cm/s LVOT Vmean:        66.500 cm/s LVOT VTI:          0.199 m LVOT/AV VTI ratio: 0.78  AORTA Ao Root diam: 2.80 cm MITRAL VALVE                 TRICUSPID VALVE MV Area (PHT): 3.43 cm     TR Peak grad:   18.8 mmHg MV Decel Time: 221 msec     TR Vmax:        217.00 cm/s MV E velocity: 64.30 cm/s MV A velocity: 104.00 cm/s  SHUNTS MV E/A ratio:  0.62         Systemic VTI:  0.20 m                             Systemic Diam: 1.70 cm Mertie Moores MD Electronically signed by Mertie Moores MD Signature Date/Time: 04/08/2021/5:24:55 PM    Final    CT HEAD CODE STROKE WO CONTRAST  Result Date: 04/08/2021 CLINICAL DATA:  Code stroke. Provided history: Neuro deficit, acute, stroke suspected. EXAM: CT HEAD WITHOUT CONTRAST TECHNIQUE: Contiguous axial images were obtained from the base of the skull through the vertex without intravenous contrast. RADIATION DOSE REDUCTION: This exam was performed according to the departmental dose-optimization program which includes automated exposure control, adjustment of the mA and/or kV according to patient size and/or use of iterative reconstruction technique. COMPARISON:  Brain MRI 04/08/2021. Non-contrast head CT and CT angiogram head/neck performed earlier today 04/08/2021. FINDINGS: Brain: Mild-to-moderate generalized cerebral atrophy. Comparatively mild cerebellar atrophy. A punctate acute cortical infarct within the left parietal lobe was better appreciated on the brain MRI performed earlier today. A small chronic cortical infarct within the lateral left frontoparietal lobes  was also better appreciated on this prior examination. Mild patchy and ill-defined hypoattenuation within the cerebral white matter, nonspecific but compatible with chronic small vessel ischemic disease. There is no acute intracranial hemorrhage. No extra-axial fluid collection. No evidence of an intracranial mass. No midline shift. Vascular: Occlusion of the left ICA within the neck and at the petrous segment was demonstrated on the CTA head/neck performed earlier today. Occlusion of a proximal M2 left MCA vessel was also demonstrated on the head CTA  performed earlier today. Atherosclerotic calcifications. Skull: Normal. Negative for fracture or focal lesion. Sinuses/Orbits: Visualized orbits show no acute finding. Mild mucosal thickening within the bilateral maxillary sinuses. ASPECTS [facility] Stroke Program Early CT Score) - Ganglionic level infarction (caudate, lentiform nuclei, internal capsule, insula, M1-M3 cortex): 7 - Supraganglionic infarction (M4-M6 cortex): 3 Total score (0-10 with 10 being normal): 10 These results were communicated to Dr. Lorrin Goodell At 6:58 pmon 3/4/2023by text page via the [facility] messaging system. IMPRESSION: A punctate acute cortical infarct within the left parietal lobe was better appreciated on the brain MRI performed earlier today. No evidence of acute intracranial hemorrhage. Chronic findings without appreciable interval change from the brain MRI performed earlier today, as described. Mild mucosal thickening within the bilateral maxillary sinuses. Electronically Signed   By: Kellie Simmering D.O.   On: 04/08/2021 18:58   CT ANGIO HEAD NECK W WO CM W PERF  Result Date: 04/08/2021 CLINICAL DATA:  Code stroke. EXAM: CT ANGIOGRAPHY HEAD AND NECK CT PERFUSION BRAIN TECHNIQUE: Multidetector CT imaging of the head and neck was performed using the standard protocol during bolus administration of intravenous contrast. Multiplanar CT image reconstructions and MIPs were obtained to evaluate the vascular anatomy. Carotid stenosis measurements (when applicable) are obtained utilizing NASCET criteria, using the distal internal carotid diameter as the denominator. Multiphase CT imaging of the brain was performed following IV bolus contrast injection. Subsequent parametric perfusion maps were calculated using RAPID software. RADIATION DOSE REDUCTION: This exam was performed according to the departmental dose-optimization program which includes automated exposure control, adjustment of the mA and/or kV according to patient size and/or use of  iterative reconstruction technique. CONTRAST:  125m OMNIPAQUE IOHEXOL 350 MG/ML SOLN COMPARISON:  Noncontrast head CT examinations, CT angiogram head/neck and brain MRI performed earlier today 04/08/2021. FINDINGS: CTA NECK FINDINGS Aortic arch: Standard aortic branching. Atherosclerotic plaque within the proximal major branch vessels of the neck. Streak and beam hardening artifact arising from a dense right-sided contrast bolus partially obscures the right subclavian artery. Within this limitation, there is no hemodynamically significant innominate or proximal subclavian artery stenosis. Right carotid system: CCA and ICA patent within the neck without significant stenosis (50% or greater). Mild atherosclerotic plaque about the carotid bifurcation. Left carotid system: The common carotid artery is patent. Atherosclerotic plaque about the carotid bifurcation. There has been interval reconstitution of flow within the cervical left ICA, although this vessel remains diminutive. Additionally, there is eccentric hypodensity within the vessel lumen at the ICA origin (which may reflect soft plaque or thrombus) with resultant severe focal stenosis at this site. Vertebral arteries: The vertebral arteries are codominant and patent within the neck without stenosis. Skeleton: Cervical spondylosis. No acute bony abnormality or aggressive osseous lesion. Grade 1 anterolisthesis at C2-C3, C3-C4 and C4-C5. Other neck: Subcentimeter thyroid nodules, not meeting consensus criteria for ultrasound follow-up based on size. Upper chest: No consolidation within the imaged lung apices. Review of the MIP images confirms the above findings CTA HEAD FINDINGS Anterior circulation: The intracranial  left ICA is now patent throughout. However, there is persistent severe narrowing of the petrous and proximal cavernous segments. New from the prior exam, subtle linear thrombus is questioned within the distal cavernous left ICA (for instance as seen  on series 7, images 129 and 130). 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 There is some reconstitution of enhancement within M2 and more distal left MCA vessels. The intracranial right ICA is patent. Mild nonstenotic calcified plaque within this vessel. The M1 right middle cerebral artery is patent. No right M2 proximal branch occlusion or high-grade proximal stenosis is identified. The anterior cerebral arteries are patent. Posterior circulation: The intracranial vertebral arteries are patent. The basilar artery is patent. Short segment fenestration within the proximal basilar artery. The posterior cerebral arteries are patent. Posterior communicating arteries are diminutive or absent bilaterally. Distal branch atherosclerotic irregularity of the right PCA. Venous sinuses: Within the limitations of contrast timing, no convincing thrombus. Anatomic variants: As described. Review of the MIP images confirms the above findings CT Brain Perfusion Findings: CBF (<30%) Volume: 29m Perfusion (Tmax>6.0s) volume: 862mMismatch Volume: 8011mnfarction Location:Left MCA vascular territory These results were called by telephone at the time of interpretation on 04/08/2021 at 7:03 pm to [facility] verb[facility] acknowledged these results. IMPRESSION: CTA neck: Since the CTA performed earlier today, there has been interval reconstitution of flow within the cervical left ICA. However, this vessel remains diminutive. Additionally, there is eccentric hypodensity within the vessel lumen at the left ICA origin (which may reflect soft plaque or thrombus) resulting in severe focal stenosis at this site. CTA head: The intracranial left internal carotid artery is now patent. However, there is persistent severe narrowing of the petrous and proximal cavernous segments. New from the prior exam, subtle linear thrombus is questioned within the distal cavernous left  ICA. 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 CT perfusion head: The perfusion software identifies core infarcts within the left MCA vascular territory totaling 6 mL. The perfusion software identifies critically hypoperfused parenchyma within the left MCA vascular territory totaling 86 mL (utilizing the Tmax>6 seconds threshold). Reported mismatch volume: 80 mL. Electronically Signed   By: KylKellie SimmeringO.   On: 04/08/2021 19:23    PHYSICAL EXAM  Physical Exam  Constitutional: Appears well-developed and well-nourished.  Psych: Affect appropriate to situation Eyes: No scleral injection Respiratory: Effort normal, non-labored breathing  Neuro: Mental Status: Patient is awake, alert, oriented to person, place, month, year, and situation. Patient is intubated, could not assess for signs of aphasia or neglect. Can nod yes and no and use fingers to indicate year and date. Cranial Nerves: II: Visual Fields are full. Pupils are equal, round, and reactive to light. OS legal blindness and OS glaucoma III,IV, VI: EOMI without ptosis or diploplia. V: Facial sensation is symmetric to temperature VII: Facial movement is symmetric resting. Tube in place, can't smile VIII: Hearing is intact to voice X: Palate elevates symmetrically-could not assess XI: Shoulder shrug is symmetric. XII: Tongue protrudes midline without atrophy or fasciculations-could not assess Motor: Tone is normal. Bulk is normal. 5/5 strength was present in left extremities. 3.5/5 strength in right upper and lower extremities. Sensory: Sensation is symmetric to light touch and temperature in the arms and legs. No extinction to DSS present.   ASSESSMENT/PLAN Ms. NelSkyley Grandmaison a 75 17o. 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 Repeat CTA with internal reconstitution of flow within the cervical left ICA.  However, the vessel remains diminutive.  There is eccentric hypodensity within the vessel lumen of the left ICA origin which may reflect soft plaque or thrombus resulting in severe focal stenosis at the site.  The intracranial left ICA demonstrated severe persistent narrowing of the petrous and the proximal cavernous segment with subtle linear ?thrombus in the distal cavernous left ICA.  The left MCA M1 is occluded shortly beyond its origin. CT perfusion demonstrates a core of 6 mL and a penumbra of 80 mL.   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 No evidence of acute ICH. CTA head: Intracranial L ICA occlusion throughout the petrous segment. There is reconstitution of enhancement within the intracranial L ICA beginning at the cavernous segment. Occlusion of proximal M2 L MCA vessel   CTA neck: L CCA is patent, however L ICA is occluded beginning at its origin and remains occluded throughout the remainder of the neck. R CCA, R IC, b/l vertebral arteries are patent without significant stenosis.   CT perfusion - 6/86 MRI: Punctate acute cortical infarct within the left parietal lobe (left MCA vascular territory). Small chronic cortically-based infarct within the lateral left frontoparietal lobes (left MCA vascular territory). Repeat MRI: New from the prior brain MRI of 04/08/2021, there are acute infarcts involving much of the left caudate and lentiform nuclei, as well as small portions of the left internal capsule. Mild petechial hemorrhage within the left basal ganglia. Several small acute cortically-based infarcts within the left frontal and parietal lobes, which are also new from the prior MRI (left MCA vascular  territory). Additionally, there are a few new punctate foci of petechial hemorrhage within the high left parietal lobe. Signal abnormality along portions of the left frontal, parietal and temporal lobes, compatible with small-volume acute subarachnoid hemorrhage Carotid Doppler: Considering to do on Tuesday 2D Echo: EF 60-65%. 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 Initial CTA showed left ICA occlusion Repeat CTA showed left ICA partially recannulized Status post angioplasty, showed ICA 30% stenosis remaining CUS in 2 days  Hypertension Home meds:  Amlodpine 5 mg daily, HCTZ 12.5 mg daily, losartan 100 mg daily Stable BP goal 120-140 within 24h of IR On cleviprex, taper off as able  Long-term BP goal normotensive  Hyperlipidemia Home meds:  N/A LDL 103, goal < 70 Add Atorvastatin 40 mg  Continue statin at discharge  Other Stroke Risk Factors Advanced Age >/= 36  Former Cigarette smoker, advised to continue abstinence  Hx stroke/TIA  Family hx stroke (father) Coronary artery disease   Hospital day # 1  Signed: Merrily Brittle, DO Resident, PGY-1 [place] 04/09/2021, 2:57 PM   ATTENDING NOTE: I reviewed above note and agree with the assessment and plan. Pt was seen and examined.   76 y.o. 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  Symptoms not improved on EMS arrival and nearly resolved in ER.  Patient had 2 similar episodes within the last 3 months. NIH score 0 but with slight clumsiness right hand and right leg.  CT no acute finding. CT head and neck done showed left ICA occlusion and likely acute left M2  occlusion. Loaded with aspirin and the Plavix.  Put on statin.  MRI showed punctate left parietal infarct.  However, several hours later patient's symptoms getting worse with right-sided weakness and aphasia.  Repeat CTA showed left ICA partially recannulized with left M1 occlusion.  CTP 6/86.  Mechanical thrombectomy performed with TICI3 left M1 and left ICA angioplasty with 30% residual stenosis.  MRI showed left BG and caudate head infarct with scattered left MCA punctate infarcts, and mild petechial hemorrhage and SAH.  On exam, patient just extubated, awake alert, eyes open, follows simple commands, mouthing words to answer orientation questions, orientated x4.  Left eye legally blind and outward gaze, right eye visual field full, slight right nasolabial fold flattening, tongue midline.  Left upper and lower extremity 5/5, right upper and lower extremity 3+/5.  Sensation symmetrical. L FTN intact.   Etiology of patient's stroke likely due to large vessel disease of left ICA high-grade stenosis.  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  Now status post EVT with TICI3 MCA and angioplasty of left ICA.  Will repeat carotid Doppler in 2 days, if left ICA still significant stenosis, may consider left ICA stenting.  Otherwise, patient much improved, will continue DAPT and statin.  PT/OT.  BP goal 1 20-1 40 within 24 hours of IR, taper off Cleviprex as able.  For detailed assessment and plan, please refer to above as I have made changes wherever appropriate.   I had long discussion with husband at bedside, updated pt current condition, treatment plan and potential prognosis, and answered all the questions.  He expressed understanding and appreciation.    Rosalin Hawking, MD PhD Stroke Neurology 04/09/2021 6:46 PM  This patient is critically ill due to left ICA occlusion, left MCA stroke, status post thrombectomy and at significant  risk of neurological worsening, death form recurrent stroke, hemorrhagic transformation, seizure. This patient's care requires constant monitoring of vital signs, hemodynamics, respiratory and cardiac monitoring, review of multiple databases, neurological assessment, discussion with family, other specialists and medical decision making of high complexity. I spent 45 minutes of neurocritical care time in the care of this patient.      To contact Stroke Continuity provider, please refer to [email or web address] After hours, contact General Neurology

## 2021-04-09 NOTE — Progress Notes (Signed)
PT Cancellation Note ? ?Patient Details ?Name: Angela Jenkins ?MRN: [id number] ?DOB: [birth year] ? ? ?Cancelled Treatment:    Reason Eval/Treat Not Completed: Medical issues which prohibited therapy. Pt intubated and sedated. ? ? ?Angela Jenkins ?04/09/2021, 9:25 AM ? ?Lorrin Goodell, PT  ?Office # [phone] ?Pager [phone] ? ?

## 2021-04-09 NOTE — Anesthesia Postprocedure Evaluation (Signed)
Anesthesia Post Note ? ?Patient: Angela Jenkins ? ?Procedure(s) Performed: IR WITH ANESTHESIA ? ?  ? ?Patient location during evaluation: SICU ?Anesthesia Type: General ?Level of consciousness: sedated ?Pain management: pain level controlled ?Vital Signs Assessment: post-procedure vital signs reviewed and stable ?Respiratory status: patient remains intubated per anesthesia plan ?Cardiovascular status: stable ?Postop Assessment: no apparent nausea or vomiting ?Anesthetic complications: no ? ? ?No notable events documented. ? ?Last Vitals:  ?Vitals:  ? 04/09/21 1702 04/09/21 1800  ?BP: (!) 116/58 102/69  ?Pulse: 75 84  ?Resp: 14 18  ?Temp:    ?SpO2: 96% 95%  ?  ?Last Pain:  ?Vitals:  ? 04/09/21 1537  ?TempSrc: Oral  ?PainSc:   ? ? ?  ?  ?  ?  ?  ?  ? ?March Rummage Shyler Holzman ? ? ? ? ?

## 2021-04-09 NOTE — Progress Notes (Addendum)
Referring Physician(s): Lavera Guise  Supervising Physician: Luanne Bras  Patient Status:  [facility] - In-pt  Chief Complaint:  LT MCA M 1 seg with x1 pass with 46mx 40 mm solitaire X retriever and contact aspiration achieving a TICI 3 revascularization.  Subjective:  Pt is still intubated But can nod yes/no Answers all questions Husband at bedside Dr DEstanislado Pandyhas seen and examined pt  Allergies: Brimonidine  Medications: Prior to Admission medications   Medication Sig Start Date End Date Taking? Authorizing Provider  amLODipine (NORVASC) 5 MG tablet Take 5 mg by mouth daily. 02/01/21  Yes [provider]  denosumab (PROLIA) 60 MG/ML SOSY injection Inject 60 mg into the skin every 6 (six) months.   Yes [provider]  dorzolamide-timolol (COSOPT) 22.3-6.8 MG/ML ophthalmic solution Place 1 drop into both eyes 2 (two) times daily.   Yes [provider]  hydrochlorothiazide (HYDRODIURIL) 12.5 MG tablet Take 12.5 mg by mouth daily. 02/01/21  Yes [provider]  latanoprost (XALATAN) 0.005 % ophthalmic solution Place 1 drop into both eyes at bedtime.   Yes [provider]  losartan (COZAAR) 100 MG tablet Take 100 mg by mouth daily. 03/30/21  Yes [provider]  VITAMIN D PO Take 20,000 Units by mouth once a week. Wednesdays   Yes [provider]     Vital Signs: BP 113/60    Pulse 74    Temp 98.2 F (36.8 C) (Axillary)    Resp 15    Ht '5\' 2"'$  (1.575 m)    Wt 123 lb 0.3 oz (55.8 kg)    SpO2 100%    BMI 22.50 kg/m   Physical Exam Vitals reviewed.  Constitutional:      Comments: intubated  Musculoskeletal:     Comments: Moves all 4s to command Nods/yes and no to questions--- appropriately  Face is symmetrical EOMI; PERRLA   Skin:    General: Skin is warm.     Comments: Rt groin site is clean and dry NT no bleeding    Imaging: MR BRAIN WO CONTRAST  Result Date: 04/08/2021 CLINICAL DATA:  Provided  history: Neuro deficit, acute, stroke suspected. EXAM: MRI HEAD WITHOUT CONTRAST TECHNIQUE: Multiplanar, multiecho pulse sequences of the brain and surrounding structures were obtained without intravenous contrast. COMPARISON:  CT angiogram head/neck and non-contrast head CT performed earlier today 04/08/2021. FINDINGS: Brain: Mild-to-moderate generalized cerebral and cerebellar atrophy. Punctate acute or cortical infarct within the left parietal lobe (series 2, image 41 (left MCA vascular territory). Small chronic cortical infarct within the lateral left frontoparietal lobes (series 6, image 26). Mild multifocal T2 FLAIR hyperintense signal abnormality within the cerebral white matter, nonspecific but compatible with chronic small vessel ischemic disease. No evidence of an intracranial mass. No chronic intracranial blood products. No extra-axial fluid collection. No midline shift. Vascular: Signal abnormality within the petrous and proximal cavernous left ICA at site of known vessel occlusion. Focus of susceptibility weighted signal loss within the left sylvian fissure, likely reflecting thrombus within an occluded proximal M2 left MCA vessel (series 7, image 53). Skull and upper cervical spine: No focal suspicious marrow lesion. Sinuses/Orbits: Visualized orbits show no acute finding. Prior left ocular lens replacement. Mild mucosal thickening within the bilateral maxillary sinuses. Trace mucosal thickening within the bilateral ethmoid sinuses. IMPRESSION: Punctate acute cortical infarct within the left parietal lobe (left MCA vascular territory). Small chronic cortically-based infarct within the lateral left frontoparietal lobes (left MCA vascular territory). Mild chronic small vessel ischemic changes  within the cerebral white matter. Signal abnormality within the petrous and proximal cavernous segments of the left ICA, at site of known vessel occlusion. Focus of susceptibility-weighted signal loss within the  left sylvian fissure, likely reflecting thrombus within an occluded proximal M2 left MCA vessel (as was demonstrated on the CTA head performed earlier today). Mild-to-moderate generalized cerebral and cerebellar atrophy. Paranasal sinus disease, as described. Electronically Signed   By: Kellie Simmering D.O.   On: 04/08/2021 16:19   Portable Chest x-ray  Result Date: 04/08/2021 CLINICAL DATA:  Endotracheal tube placement. EXAM: PORTABLE CHEST 1 VIEW COMPARISON:  07/15/2011. FINDINGS: The heart size and mediastinal contours are within normal limits. No consolidation, effusion, or pneumothorax. An endotracheal tube terminates 3.1 cm above the carina. An enteric tube terminates in the stomach. Multiple old rib fractures are noted on the right with bone callus formation. IMPRESSION: 1. No active disease. 2. Endotracheal and enteric tubes as described above. Electronically Signed   By: Brett Fairy M.D.   On: 04/08/2021 23:07   ECHOCARDIOGRAM COMPLETE  Result Date: 04/08/2021    ECHOCARDIOGRAM REPORT   Patient Name:   ARMA REINING Date of Exam: 04/08/2021 Medical Rec #:  [id number]   Height:       63.0 in Accession #:    [id number]  Weight:       121.3 lb Date of Birth:  [birth year]   BSA:          1.563 m Patient Age:    32 years    BP:           134/72 mmHg Patient Gender: F           HR:           87 bpm. Exam Location:  Inpatient Procedure: 2D Echo, Cardiac Doppler and Color Doppler Indications:    Stroke  History:        Patient has no prior history of Echocardiogram examinations.  Sonographer:    Merrie Roof RDCS Referring Phys: [id number] [place]  1. Left ventricular ejection fraction, by estimation, is 60 to 65%. The left ventricle has normal function. The left ventricle has no regional wall motion abnormalities. Left ventricular diastolic parameters were normal.  2. Right ventricular systolic function is normal. The right ventricular size is normal. There is normal pulmonary artery systolic  pressure.  3. The mitral valve is normal in structure. No evidence of mitral valve regurgitation. No evidence of mitral stenosis.  4. The aortic valve is normal in structure. Aortic valve regurgitation is not visualized. No aortic stenosis is present. FINDINGS  Left Ventricle: Left ventricular ejection fraction, by estimation, is 60 to 65%. The left ventricle has normal function. The left ventricle has no regional wall motion abnormalities. The left ventricular internal cavity size was normal in size. There is  no left ventricular hypertrophy. Left ventricular diastolic parameters were normal. Right Ventricle: The right ventricular size is normal. Right vetricular wall thickness was not well visualized. Right ventricular systolic function is normal. There is normal pulmonary artery systolic pressure. The tricuspid regurgitant velocity is 2.17 m/s, and with an assumed right atrial pressure of 3 mmHg, the estimated right ventricular systolic pressure is 62.9 mmHg. Left Atrium: Left atrial size was normal in size. Right Atrium: Right atrial size was normal in size. Pericardium: There is no evidence of pericardial effusion. Mitral Valve: The mitral valve is normal in structure. No evidence of mitral valve regurgitation. No evidence of mitral valve stenosis. Tricuspid Valve:  The tricuspid valve is normal in structure. Tricuspid valve regurgitation is trivial. Aortic Valve: The aortic valve is normal in structure. Aortic valve regurgitation is not visualized. No aortic stenosis is present. Aortic valve mean gradient measures 3.0 mmHg. Aortic valve peak gradient measures 6.2 mmHg. Aortic valve area, by VTI measures 1.78 cm. Pulmonic Valve: The pulmonic valve was not well visualized. Pulmonic valve regurgitation is not visualized. Aorta: The aortic root and ascending aorta are structurally normal, with no evidence of dilitation. IAS/Shunts: The atrial septum is grossly normal.  LEFT VENTRICLE PLAX 2D LVIDd:         4.00 cm    Diastology LVIDs:         2.70 cm   LV e' medial:    5.66 cm/s LV PW:         0.90 cm   LV E/e' medial:  11.4 LV IVS:        1.00 cm   LV e' lateral:   7.07 cm/s LVOT diam:     1.70 cm   LV E/e' lateral: 9.1 LV SV:         45 LV SV Index:   29 LVOT Area:     2.27 cm  RIGHT VENTRICLE RV Basal diam:  3.10 cm LEFT ATRIUM             Index        RIGHT ATRIUM          Index LA diam:        2.70 cm 1.73 cm/m   RA Area:     9.96 cm LA Vol (A2C):   40.1 ml 25.65 ml/m  RA Volume:   18.70 ml 11.96 ml/m LA Vol (A4C):   34.5 ml 22.07 ml/m LA Biplane Vol: 37.7 ml 24.12 ml/m  AORTIC VALVE AV Area (Vmax):    1.76 cm AV Area (Vmean):   1.73 cm AV Area (VTI):     1.78 cm AV Vmax:           124.00 cm/s AV Vmean:          87.200 cm/s AV VTI:            0.254 m AV Peak Grad:      6.2 mmHg AV Mean Grad:      3.0 mmHg LVOT Vmax:         96.00 cm/s LVOT Vmean:        66.500 cm/s LVOT VTI:          0.199 m LVOT/AV VTI ratio: 0.78  AORTA Ao Root diam: 2.80 cm MITRAL VALVE                TRICUSPID VALVE MV Area (PHT): 3.43 cm     TR Peak grad:   18.8 mmHg MV Decel Time: 221 msec     TR Vmax:        217.00 cm/s MV E velocity: 64.30 cm/s MV A velocity: 104.00 cm/s  SHUNTS MV E/A ratio:  0.62         Systemic VTI:  0.20 m                             Systemic Diam: 1.70 cm Mertie Moores MD Electronically signed by Mertie Moores MD Signature Date/Time: 04/08/2021/5:24:55 PM    Final    CT HEAD CODE STROKE WO CONTRAST  Result Date: 04/08/2021 CLINICAL DATA:  Code stroke. Provided history:  Neuro deficit, acute, stroke suspected. EXAM: CT HEAD WITHOUT CONTRAST TECHNIQUE: Contiguous axial images were obtained from the base of the skull through the vertex without intravenous contrast. RADIATION DOSE REDUCTION: This exam was performed according to the departmental dose-optimization program which includes automated exposure control, adjustment of the mA and/or kV according to patient size and/or use of iterative reconstruction technique.  COMPARISON:  Brain MRI 04/08/2021. Non-contrast head CT and CT angiogram head/neck performed earlier today 04/08/2021. FINDINGS: Brain: Mild-to-moderate generalized cerebral atrophy. Comparatively mild cerebellar atrophy. A punctate acute cortical infarct within the left parietal lobe was better appreciated on the brain MRI performed earlier today. A small chronic cortical infarct within the lateral left frontoparietal lobes was also better appreciated on this prior examination. Mild patchy and ill-defined hypoattenuation within the cerebral white matter, nonspecific but compatible with chronic small vessel ischemic disease. There is no acute intracranial hemorrhage. No extra-axial fluid collection. No evidence of an intracranial mass. No midline shift. Vascular: Occlusion of the left ICA within the neck and at the petrous segment was demonstrated on the CTA head/neck performed earlier today. Occlusion of a proximal M2 left MCA vessel was also demonstrated on the head CTA performed earlier today. Atherosclerotic calcifications. Skull: Normal. Negative for fracture or focal lesion. Sinuses/Orbits: Visualized orbits show no acute finding. Mild mucosal thickening within the bilateral maxillary sinuses. ASPECTS [facility] Stroke Program Early CT Score) - Ganglionic level infarction (caudate, lentiform nuclei, internal capsule, insula, M1-M3 cortex): 7 - Supraganglionic infarction (M4-M6 cortex): 3 Total score (0-10 with 10 being normal): 10 These results were communicated to Dr. Lorrin Goodell At 6:58 pmon 3/4/2023by text page via the [facility] messaging system. IMPRESSION: A punctate acute cortical infarct within the left parietal lobe was better appreciated on the brain MRI performed earlier today. No evidence of acute intracranial hemorrhage. Chronic findings without appreciable interval change from the brain MRI performed earlier today, as described. Mild mucosal thickening within the bilateral maxillary sinuses.  Electronically Signed   By: Kellie Simmering D.O.   On: 04/08/2021 18:58   CT HEAD CODE STROKE WO CONTRAST  Result Date: 04/08/2021 CLINICAL DATA:  Code stroke. Neuro deficit, acute, stroke suspected. Right-sided weakness. EXAM: CT HEAD WITHOUT CONTRAST TECHNIQUE: Contiguous axial images were obtained from the base of the skull through the vertex without intravenous contrast. RADIATION DOSE REDUCTION: This exam was performed according to the departmental dose-optimization program which includes automated exposure control, adjustment of the mA and/or kV according to patient size and/or use of iterative reconstruction technique. COMPARISON:  No pertinent prior exams available for comparison. FINDINGS: Brain: Mild-to-moderate generalized cerebral and cerebellar atrophy. Mild ill-defined hypoattenuation within the periventricular white matter, nonspecific but compatible chronic small vessel ischemic disease. There is no acute intracranial hemorrhage. No demarcated cortical infarct. No extra-axial fluid collection. No evidence of an intracranial mass. No midline shift. Vascular: No hyperdense vessel.  Atherosclerotic calcifications. Skull: Normal. Negative for fracture or focal lesion. Sinuses/Orbits: Visualized orbits show no acute finding. No significant paranasal sinus disease at the imaged levels. ASPECTS [facility] Stroke Program Early CT Score) - Ganglionic level infarction (caudate, lentiform nuclei, internal capsule, insula, M1-M3 cortex): 7 - Supraganglionic infarction (M4-M6 cortex): 3 Total score (0-10 with 10 being normal): 10 These results were communicated to Dr. Erlinda Hong At 12:33 pmon 3/4/2023by text page via the [facility] messaging system. IMPRESSION: No evidence of acute intracranial abnormality. Mild chronic small vessel given changes within the cerebral white matter. Mild-to-moderate generalized parenchymal atrophy. Electronically Signed   By: Kellie Simmering D.O.  On: 04/08/2021 12:33   CT ANGIO HEAD NECK W WO CM  W PERF  Result Date: 04/08/2021 CLINICAL DATA:  Code stroke. EXAM: CT ANGIOGRAPHY HEAD AND NECK CT PERFUSION BRAIN TECHNIQUE: Multidetector CT imaging of the head and neck was performed using the standard protocol during bolus administration of intravenous contrast. Multiplanar CT image reconstructions and MIPs were obtained to evaluate the vascular anatomy. Carotid stenosis measurements (when applicable) are obtained utilizing NASCET criteria, using the distal internal carotid diameter as the denominator. Multiphase CT imaging of the brain was performed following IV bolus contrast injection. Subsequent parametric perfusion maps were calculated using RAPID software. RADIATION DOSE REDUCTION: This exam was performed according to the departmental dose-optimization program which includes automated exposure control, adjustment of the mA and/or kV according to patient size and/or use of iterative reconstruction technique. CONTRAST:  170m OMNIPAQUE IOHEXOL 350 MG/ML SOLN COMPARISON:  Noncontrast head CT examinations, CT angiogram head/neck and brain MRI performed earlier today 04/08/2021. FINDINGS: CTA NECK FINDINGS Aortic arch: Standard aortic branching. Atherosclerotic plaque within the proximal major branch vessels of the neck. Streak and beam hardening artifact arising from a dense right-sided contrast bolus partially obscures the right subclavian artery. Within this limitation, there is no hemodynamically significant innominate or proximal subclavian artery stenosis. Right carotid system: CCA and ICA patent within the neck without significant stenosis (50% or greater). Mild atherosclerotic plaque about the carotid bifurcation. Left carotid system: The common carotid artery is patent. Atherosclerotic plaque about the carotid bifurcation. There has been interval reconstitution of flow within the cervical left ICA, although this vessel remains diminutive. Additionally, there is eccentric hypodensity within the vessel  lumen at the ICA origin (which may reflect soft plaque or thrombus) with resultant severe focal stenosis at this site. Vertebral arteries: The vertebral arteries are codominant and patent within the neck without stenosis. Skeleton: Cervical spondylosis. No acute bony abnormality or aggressive osseous lesion. Grade 1 anterolisthesis at C2-C3, C3-C4 and C4-C5. Other neck: Subcentimeter thyroid nodules, not meeting consensus criteria for ultrasound follow-up based on size. Upper chest: No consolidation within the imaged lung apices. Review of the MIP images confirms the above findings CTA HEAD FINDINGS Anterior circulation: The intracranial left ICA is now patent throughout. However, there is persistent severe narrowing of the petrous and proximal cavernous segments. New from the prior exam, subtle linear thrombus is questioned within the distal cavernous left ICA (for instance as seen on series 7, images 129 and 130). 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 There is some reconstitution of enhancement within M2 and more distal left MCA vessels. The intracranial right ICA is patent. Mild nonstenotic calcified plaque within this vessel. The M1 right middle cerebral artery is patent. No right M2 proximal branch occlusion or high-grade proximal stenosis is identified. The anterior cerebral arteries are patent. Posterior circulation: The intracranial vertebral arteries are patent. The basilar artery is patent. Short segment fenestration within the proximal basilar artery. The posterior cerebral arteries are patent. Posterior communicating arteries are diminutive or absent bilaterally. Distal branch atherosclerotic irregularity of the right PCA. Venous sinuses: Within the limitations of contrast timing, no convincing thrombus. Anatomic variants: As described. Review of the MIP images confirms the above findings CT Brain Perfusion Findings: CBF (<30%) Volume: 665m Perfusion (Tmax>6.0s) volume: 8623mismatch Volume: 49m96mfarction Location:Left MCA vascular territory These results were called by telephone at the time of interpretation on 04/08/2021 at 7:03 pm to [facility]  who verbally acknowledged these results. IMPRESSION: CTA neck: Since the CTA performed earlier today, there has been interval reconstitution of flow within the cervical left ICA. However, this vessel remains diminutive. Additionally, there is eccentric hypodensity within the vessel lumen at the left ICA origin (which may reflect soft plaque or thrombus) resulting in severe focal stenosis at this site. CTA head: The intracranial left internal carotid artery is now patent. However, there is persistent severe narrowing of the petrous and proximal cavernous segments. New from the prior exam, subtle linear thrombus is questioned within the distal cavernous left ICA. 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 CT perfusion head: The perfusion software identifies core infarcts within the left MCA vascular territory totaling 6 mL. The perfusion software identifies critically hypoperfused parenchyma within the left MCA vascular territory totaling 86 mL (utilizing the Tmax>6 seconds threshold). Reported mismatch volume: 80 mL. Electronically Signed   By: Kellie Simmering D.O.   On: 04/08/2021 19:23   CT ANGIO HEAD NECK W WO CM (CODE STROKE)  Result Date: 04/08/2021 CLINICAL DATA:  Provided history: Code stroke. Right-sided weakness. EXAM: CT ANGIOGRAPHY HEAD AND NECK TECHNIQUE: Multidetector CT imaging of the head and neck was performed using the standard protocol during bolus administration of intravenous contrast. Multiplanar CT image reconstructions and MIPs were obtained to evaluate the vascular anatomy. Carotid stenosis measurements (when applicable) are obtained utilizing NASCET criteria, using the distal internal carotid diameter as the  denominator. RADIATION DOSE REDUCTION: This exam was performed according to the departmental dose-optimization program which includes automated exposure control, adjustment of the mA and/or kV according to patient size and/or use of iterative reconstruction technique. CONTRAST:  54m OMNIPAQUE IOHEXOL 350 MG/ML SOLN COMPARISON:  Noncontrast head CT performed earlier today 04/08/2021. FINDINGS: CTA NECK FINDINGS Aortic arch: Standard aortic branching. Atherosclerotic plaque within the visualized aortic arch and proximal major branch vessels of the neck. Streak and beam hardening artifact arising from a dense right-sided contrast bolus partially obscures the right subclavian artery. Within this limitation, there is no appreciable hemodynamically significant stenosis within the innominate or proximal subclavian arteries. Right carotid system: CCA and ICA patent within the neck without significant stenosis (50% or greater). Mild atherosclerotic plaque about the carotid bifurcation. Left carotid system: The common carotid artery is patent. 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 Atherosclerotic plaque within the distal CCA and about the carotid bifurcation. Vertebral arteries: Vertebral arteries codominant and patent within the neck without stenosis. Skeleton: Cervical spondylosis. No acute bony abnormality or aggressive osseous lesion. Grade 1 anterolisthesis at C2-C3, C3-C4 and C4-C5. Other neck: Subcentimeter thyroid nodules, not meeting consensus criteria for ultrasound follow-up based on size. Upper chest: No consolidation within the imaged lung apices. Review of the MIP images confirms the above findings CTA HEAD FINDINGS Anterior circulation: The intracranial right ICA is patent. 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 There is reconstitution of enhancement within the intracranial left ICA beginning at the  cavernous segment. The M1 middle cerebral arteries are patent. There is occlusion of a proximal M2 left MCA vessel (series 12, image 28). The anterior cerebral arteries are patent. No intracranial aneurysm is identified. Posterior circulation: The intracranial vertebral arteries are patent. The basilar artery is patent. Short segment fenestration within the proximal basilar artery. The posterior cerebral arteries are patent. Posterior communicating arteries are diminutive or absent bilaterally. Venous  sinuses: Within the limitations of contrast timing, no convincing thrombus. Anatomic variants: As described. Review of the MIP images confirms the above findings CTA head impression called by telephone at the time of interpretation on 04/08/2021 at 1:03 pm to provider [facility] , who verbally acknowledged these results. IMPRESSION: CTA neck: 1. The left common carotid artery is patent. 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 2. The right common carotid, right internal carotid and bilateral vertebral arteries are patent within the neck without hemodynamically significant stenosis. 3.  Aortic Atherosclerosis (ICD10-I70.0). CTA head: 1. 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 There is reconstitution of enhancement within the intracranial left ICA beginning at the cavernous segment. 2. There is occlusion of a proximal M2 left MCA vessel (series 12, image 28). Electronically Signed   By: Kellie Simmering D.O.   On: 04/08/2021 13:04    Labs:  CBC: Recent Labs    04/08/21 1214 04/08/21 1230 04/08/21 2228 04/08/21 2229 04/09/21 0529  WBC 8.2  --   --  10.4 14.2*  HGB 15.9* 16.7* 13.9 15.0 13.4  HCT 45.8 49.0* 41.0 43.0 37.9  PLT 317  --   --  326 325    COAGS: Recent Labs    04/08/21 1214  INR 1.0  APTT 26    BMP: Recent Labs    04/08/21 1214 04/08/21 1230 04/08/21 2228 04/08/21 2229  04/09/21 0529  NA 134* 133* 135  --  136  K 4.3 4.3 3.0*  --  3.4*  CL 97* 100  --   --  102  CO2 22  --   --   --  23  GLUCOSE 110* 105*  --   --  126*  BUN 12 14  --   --  6*  CALCIUM 10.3  --   --   --  8.1*  CREATININE 0.73 0.70  --  0.51 0.61  GFRNONAA >60  --   --  >60 >60    LIVER FUNCTION TESTS: Recent Labs    04/08/21 1214  BILITOT 0.6  AST 22  ALT 16  ALKPHOS 54  PROT 7.5  ALBUMIN 4.4    Assessment and Plan:  CVA L MCA revascularization in NIR 3/4 pm Doing well Probable extubate today For MRI this am   Electronically Signed: Lavonia Drafts, PA-C 04/09/2021, 9:32 AM   I spent a total of 15 Minutes at the the patient's bedside AND on the patient's hospital floor or unit, greater than 50% of which was counseling/coordinating care for L MCA revascularization

## 2021-04-10 ENCOUNTER — Other Ambulatory Visit: Payer: Self-pay | Admitting: Radiology

## 2021-04-10 ENCOUNTER — Encounter (HOSPITAL_COMMUNITY): Payer: Self-pay | Admitting: Interventional Radiology

## 2021-04-10 DIAGNOSIS — R531 Weakness: Secondary | ICD-10-CM

## 2021-04-10 DIAGNOSIS — I6522 Occlusion and stenosis of left carotid artery: Secondary | ICD-10-CM | POA: Diagnosis not present

## 2021-04-10 DIAGNOSIS — I63512 Cerebral infarction due to unspecified occlusion or stenosis of left middle cerebral artery: Secondary | ICD-10-CM

## 2021-04-10 DIAGNOSIS — I63232 Cerebral infarction due to unspecified occlusion or stenosis of left carotid arteries: Secondary | ICD-10-CM

## 2021-04-10 DIAGNOSIS — E876 Hypokalemia: Secondary | ICD-10-CM

## 2021-04-10 DIAGNOSIS — I6602 Occlusion and stenosis of left middle cerebral artery: Secondary | ICD-10-CM | POA: Diagnosis not present

## 2021-04-10 LAB — CBC
HCT: 34.7 % — ABNORMAL LOW (ref 36.0–46.0)
Hemoglobin: 11.9 g/dL — ABNORMAL LOW (ref 12.0–15.0)
MCH: 34.7 pg — ABNORMAL HIGH (ref 26.0–34.0)
MCHC: 34.3 g/dL (ref 30.0–36.0)
MCV: 101.2 fL — ABNORMAL HIGH (ref 80.0–100.0)
Platelets: 262 10*3/uL (ref 150–400)
RBC: 3.43 MIL/uL — ABNORMAL LOW (ref 3.87–5.11)
RDW: 13 % (ref 11.5–15.5)
WBC: 9.9 10*3/uL (ref 4.0–10.5)
nRBC: 0 % (ref 0.0–0.2)

## 2021-04-10 LAB — BASIC METABOLIC PANEL
Anion gap: 9 (ref 5–15)
BUN: 6 mg/dL — ABNORMAL LOW (ref 8–23)
CO2: 26 mmol/L (ref 22–32)
Calcium: 8 mg/dL — ABNORMAL LOW (ref 8.9–10.3)
Chloride: 105 mmol/L (ref 98–111)
Creatinine, Ser: 0.71 mg/dL (ref 0.44–1.00)
GFR, Estimated: >60 mL/min (ref >60)
Glucose, Bld: 102 mg/dL — ABNORMAL HIGH (ref 70–99)
Potassium: 2.9 mmol/L — ABNORMAL LOW (ref 3.5–5.1)
Sodium: 140 mmol/L (ref 135–145)

## 2021-04-10 LAB — MAGNESIUM: Magnesium: 1.7 mg/dL (ref 1.7–2.4)

## 2021-04-10 MED ORDER — MAGNESIUM SULFATE 2 GM/50ML IV SOLN
2.0000 g | Freq: Once | INTRAVENOUS | Status: AC
  Administered 2021-04-10: 2 g via INTRAVENOUS
  Filled 2021-04-10: qty 50

## 2021-04-10 MED ORDER — POTASSIUM CHLORIDE 20 MEQ PO PACK
60.0000 meq | PACK | Freq: Once | ORAL | Status: AC
  Administered 2021-04-10: 60 meq via ORAL
  Filled 2021-04-10: qty 3

## 2021-04-10 NOTE — Progress Notes (Addendum)
STROKE TEAM PROGRESS NOTE   INTERVAL HISTORY Her husband is at the bedside. Patient is awake and alert, sitting comfortably in chair. Was seen earlier ambulating around the unit with some right sided weakness and some gait instability. Patient denied subjective weakness, but residual RLE weakness noted on exam along with right facial droop. She denied hypotension at home, with average BP ~125/80s at home.   Vitals:   04/10/21 0900 04/10/21 1000 04/10/21 1100 04/10/21 1145  BP: 136/61 121/63 (!) 99/52   Pulse: 74 67 65   Resp: '14 14 20   '$ Temp:    98.6 F (37 C)  TempSrc:    Oral  SpO2: 97% 97% 98%   Weight:      Height:       CBC:  Recent Labs  Lab 04/08/21 1214 04/08/21 1230 04/09/21 0529 04/10/21 0409  WBC 8.2   < > 14.2* 9.9  NEUTROABS 5.5  --   --   --   HGB 15.9*   < > 13.4 11.9*  HCT 45.8   < > 37.9 34.7*  MCV 98.7   < > 98.2 101.2*  PLT 317   < > 325 262   < > = values in this interval not displayed.   Basic Metabolic Panel:  Recent Labs  Lab 04/09/21 0529 04/10/21 0409  NA 136 140  K 3.4* 2.9*  CL 102 105  CO2 23 26  GLUCOSE 126* 102*  BUN 6* 6*  CREATININE 0.61 0.71  CALCIUM 8.1* 8.0*  MG  --  1.7   Lipid Panel:  Recent Labs  Lab 04/09/21 0529  CHOL 215*  TRIG 149  HDL 82  CHOLHDL 2.6  VLDL 30  LDLCALC 103*    HgbA1c:  Recent Labs  Lab 04/09/21 0529  HGBA1C 5.5    Urine Drug Screen: No results for input(s): LABOPIA, COCAINSCRNUR, LABBENZ, AMPHETMU, THCU, LABBARB in the last 168 hours.  Alcohol Level No results for input(s): ETH in the last 168 hours.  IMAGING past 24 hours No results found.  PHYSICAL EXAM Physical Exam  Constitutional: Appears well-developed and well-nourished.  Psych: Affect appropriate to situation Eyes: No scleral injection Respiratory: Effort normal, non-labored breathing Gait: unstable, right leg weakness  Neuro: Mental Status: Patient is awake, alert, oriented to person, place, month, year, and  situation. Patient no signs of aphasia or neglect.  Cranial Nerves: II: Visual Fields are full. Pupils are equal, round, and reactive to light. OS legal blindness and OS glaucoma III,IV, VI: EOMI without ptosis or diploplia. V: Facial sensation is symmetric to temperature VII: Facial movement is symmetric resting and smiling VIII: Hearing is intact to voice XI: Shoulder shrug is symmetric. XII: Tongue protrudes midline without atrophy or fasciculations Motor: Tone is normal. Bulk is normal. 5/5 strength was present in left extremities and RUE. 4/5 strength in right lower extremities.  Sensory: Sensation is symmetric to light touch and temperature in the arms and legs. No extinction to DSS present.   ASSESSMENT/PLAN [name] is a 76 y.o. 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  Symptoms not improved on EMS arrival and nearly resolved in ER.  Patient had 2 similar episodes within the last 3 months. NIH score 0 but with slight clumsiness right hand and right leg.  CT no acute finding. CT head and neck done showed left  ICA occlusion and likely acute left M2 occlusion. Loaded with aspirin and the Plavix.  Put on statin.  MRI showed punctate left parietal infarct.  However, several hours later patient's symptoms getting worse with right-sided weakness and aphasia.  Repeat CTA showed left ICA partially recannulized with left M1 occlusion.  CTP 6/86.  Mechanical thrombectomy performed with TICI3 left M1 and left ICA angioplasty with 30% residual stenosis.  MRI showed left BG and caudate head infarct with scattered left MCA punctate infarcts, and mild petechial hemorrhage and SAH.  Etiology of patient's stroke likely due to large vessel disease of left ICA high-grade stenosis.  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  Now status post EVT with TICI3 MCA and angioplasty of left ICA.  Will repeat carotid Doppler in 2 days, if left ICA still significant stenosis, may consider left ICA stenting.  Otherwise, patient much improved, will continue DAPT and statin.  PT/OT.  BP goal 120-140 within 24 hours of IR. Off Cleviprex, will transition out of ICU.  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 No evidence of acute ICH. CTA head: Intracranial L ICA occlusion throughout the petrous segment. There is reconstitution of enhancement within the intracranial L ICA beginning at the cavernous segment. Occlusion of proximal M2 L MCA vessel   CTA neck: L CCA is patent, however L ICA is occluded beginning at its origin and remains occluded throughout the remainder of the neck. R CCA, R IC, b/l vertebral arteries are patent without significant stenosis.   CT perfusion - 6/86 MRI: Punctate acute cortical infarct within the left parietal lobe (left MCA vascular territory). Small chronic cortically-based infarct within the lateral left frontoparietal lobes (left MCA vascular territory). Repeat MRI: New from the prior brain MRI of 04/08/2021, there are acute infarcts involving much of the left caudate and lentiform nuclei, as well as small portions of the left internal capsule. Mild petechial hemorrhage within the left basal ganglia. Several small acute cortically-based infarcts within the left frontal and parietal lobes, which are also new from the prior MRI (left MCA vascular territory). Additionally, there are a few new punctate foci of petechial hemorrhage within the high left parietal lobe. Signal abnormality along portions of the left frontal, parietal and temporal lobes, compatible with  small-volume acute subarachnoid hemorrhage Carotid Doppler: Considering to do on 04/11/2021 2D Echo: EF 60-65%. 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 Initial CTA showed left ICA occlusion Repeat CTA showed left ICA partially recannulized Status post angioplasty, showed ICA 30% stenosis remaining CUS on 04/11/2021  Hypertension Home meds:  Amlodpine 5 mg daily, HCTZ 12.5 mg daily, losartan 100 mg daily Stable Avoid low BP Long-term BP goal 120-150 given ICA stenosis  Hyperlipidemia Home meds:  N/A LDL 103, goal < 70 Add Atorvastatin 40 mg  Continue statin at discharge  Other Stroke Risk Factors Advanced Age >/= 5  Former Cigarette smoker, advised to continue abstinence  Hx stroke/TIA  Family hx stroke (father) Coronary artery disease   Hospital day # 2  Signed: Merrily Brittle, DO Resident, PGY-1 [place] 04/10/2021, 2:26 PM   ATTENDING NOTE: I reviewed above note and agree with the assessment and plan. Pt was seen and examined.   Husband at bedside.  Patient sitting in chair, working with PT/OT, had walked with PT OT in the hallway with slight right lower extremity weakness, but right upper extremity weakness largely resolved.  Patient awake alert, no aphasia, follows simple commands.  PT/OT not recommend home health.  Will perform carotid Doppler tomorrow to further evaluate left ICA.  If still significant stenosis, may consider left ICA stenting.  Continue aspirin Plavix and statin.  For detailed assessment and plan, please refer to above as I have made changes wherever appropriate.   Rosalin Hawking, MD PhD Stroke Neurology 04/10/2021 3:19 PM  This patient is  critically ill due to left ICA occlusion, left MCA stroke, status post thrombectomy and at significant risk of neurological worsening, death form recurrent stroke, hemorrhagic transformation, seizure. This patient's care requires constant monitoring of vital signs, hemodynamics, respiratory and cardiac monitoring, review of multiple databases, neurological assessment, discussion with family, other specialists and medical decision making of high complexity. I spent 30 minutes of neurocritical care time in the care of this patient. I had long discussion with patient and husband at bedside, updated pt current condition, treatment plan and potential prognosis, and answered all the questions.  They expressed understanding and appreciation.      To contact Stroke Continuity provider, please refer to [email or web address] After hours, contact General Neurology

## 2021-04-10 NOTE — Progress Notes (Signed)
? ?NAMEDietra Jenkins, MRN:  [id number], DOB:  04-16-1945, LOS: 2 ?ADMISSION DATE:  04/08/2021, CONSULTATION DATE:  3/4 ?REFERRING MD:  Lorrin Goodell, CHIEF COMPLAINT:  right sided weakness  ? ?History of Present Illness:  ?76 y/o female presented with right sided weakness and aphasia on 3/4. Symptoms initially improved in the ER, the patient was admitted but then symptoms worsened while the patient was holding for admission in the ER.   ? ?Pertinent  Medical History  ?Glaucoma ?Hypertension ? ?Significant Hospital Events: ?Including procedures, antibiotic start and stop dates in addition to other pertinent events   ?3/4 presented for right sided facial droop and weakness in morning, symptoms resolved spontaneously then recurred while holding for admission in the ER.  Initial CT angiogram showed occlusion in the left ICA, prox M2 left MCA. MRI brain then showed left parietal lobe cortical infarct, mild chornic small vessel ischemic changes in cerebral white matter, thrombus in M2 left MCA. Repeat CTA showed interval reconstitution of flow in left cervical ICA, though the vessel is diminutive. 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  Went to IR for left MCA thrombus removal and balloon angioplasty of L ICA, post CT showed subarachnoid hyper attenuation  ?3/6 Extubated, alert, oriented and stable ? ?Interim History / Subjective:  ? ?Extubated yesterday, sitting up and conversational this morning without complaints  ? ?Objective   ?Blood pressure (!) 106/55, pulse 70, temperature 98.1 ?F (36.7 ?C), temperature source Oral, resp. rate 19, height '5\' 2"'$  (1.575 m), weight 55.8 kg, SpO2 91 %. ?   ?   ? ?Intake/Output Summary (Last 24 hours) at 04/10/2021 0759 ?Last data filed at 04/10/2021 0400 ?Gross per 24 hour  ?Intake 703.98 ml  ?Output 1125 ml  ?Net -421.02 ml  ? ? ?Filed Weights  ? 04/08/21 1200 04/08/21 2210  ?Weight: 55 kg 55.8 kg  ? ? ?General:   well-nourished elderly F, sitting up in bed in no distress ?HEENT: MM pink/moist, pupils equal and sclera anicteric  ?Neuro: LLL slightly weaker than R, though able to lift both extremities off the bed, UE strength equal, slight R facial droop with smile ?CV: s1s2 rrr, no m/r/g ?PULM:  lung clear on RA without Rhonchi or wheezing  ?GI: soft, bsx4 active , non-tender ?Extremities: warm/dry, no edema  ?Skin: no rashes or lesions ? ? ?Resolved Hospital Problem list   ? ? ?Assessment & Plan:  ? ?Acute CVA left MCA and ICA occlusion, s/p thrombectomy ?Concern for [facility] post thrombectomy ?-plan per neurology ?-Goal SBP 120-140 per neuro IR ?-PT/OT/SLP ?-Asa, plavix, statin ? ?Acute respiratory failure with hypoxemia due to inability to protect airway  ?Initially left intubated, extubated yesterday  ?-passed swallow screen ? ? ?Hypertension ?-off cleviprex this AM,  resumption of home anti-hypertensives per Neurology plan, on Norvasc, HCTA and Losartan at home ? ? ?Hypokalemia ?-replete this AM and check Mag ? ? ?No further critical care needs at this time, PCCM will sign off, please re-engage for any concerns ? ?Best Practice (right click and "Reselect all SmartList Selections" daily)  ? ?Per primary ? ?Labs   ?CBC: ?Recent Labs  ?Lab 04/08/21 ?1214 04/08/21 ?1230 04/08/21 ?2228 04/08/21 ?2229 04/09/21 ?3267 04/10/21 ?0409  ?WBC 8.2  --   --  10.4 14.2* 9.9  ?NEUTROABS 5.5  --   --   --   --   --   ?HGB 15.9* 16.7* 13.9 15.0 13.4  11.9*  ?HCT 45.8 49.0* 41.0 43.0 37.9 34.7*  ?MCV 98.7  --   --  98.4 98.2 101.2*  ?PLT 317  --   --  326 325 262  ? ? ? ?Basic Metabolic Panel: ?Recent Labs  ?Lab 04/08/21 ?1214 04/08/21 ?1230 04/08/21 ?2228 04/08/21 ?2229 04/09/21 ?3888 04/10/21 ?0409  ?NA 134* 133* 135  --  136 140  ?K 4.3 4.3 3.0*  --  3.4* 2.9*  ?CL 97* 100  --   --  102 105  ?CO2 22  --   --   --  23 26  ?GLUCOSE 110* 105*  --   --  126* 102*  ?BUN 12 14  --   --  6* 6*  ?CREATININE 0.73 0.70  --  0.51 0.61 0.71  ?CALCIUM  10.3  --   --   --  8.1* 8.0*  ? ? ?GFR: ?Estimated Creatinine Clearance: 48.1 mL/min (by C-G formula based on SCr of 0.71 mg/dL). ?Recent Labs  ?Lab 04/08/21 ?1214 04/08/21 ?2229 04/09/21 ?7579 04/10/21 ?0409  ?WBC 8.2 10.4 14.2* 9.9  ? ? ? ?Liver Function Tests: ?Recent Labs  ?Lab 04/08/21 ?1214  ?AST 22  ?ALT 16  ?ALKPHOS 54  ?BILITOT 0.6  ?PROT 7.5  ?ALBUMIN 4.4  ? ? ?No results for input(s): LIPASE, AMYLASE in the last 168 hours. ?No results for input(s): AMMONIA in the last 168 hours. ? ?ABG ?   ?Component Value Date/Time  ? PHART 7.373 04/08/2021 2228  ? PCO2ART 39.3 04/08/2021 2228  ? PO2ART 418 (H) 04/08/2021 2228  ? HCO3 22.9 04/08/2021 2228  ? TCO2 24 04/08/2021 2228  ? ACIDBASEDEF 2.0 04/08/2021 2228  ? O2SAT 100 04/08/2021 2228  ? ?  ? ?Coagulation Profile: ?Recent Labs  ?Lab 04/08/21 ?1214  ?INR 1.0  ? ? ? ?Cardiac Enzymes: ?No results for input(s): CKTOTAL, CKMB, CKMBINDEX, TROPONINI in the last 168 hours. ? ?HbA1C: ?Hgb A1c MFr Bld  ?Date/Time Value Ref Range Status  ?04/09/2021 05:29 AM 5.5 4.8 - 5.6 % Final  ?  Comment:  ?  (NOTE) ?Pre diabetes:          5.7%-6.4% ? ?Diabetes:              >6.4% ? ?Glycemic control for   <7.0% ?adults with diabetes ?  ? ? ?CBG: ?Recent Labs  ?Lab 04/08/21 ?1215 04/08/21 ?1904  ?GLUCAP 97 103*  ? ? ? ?Critical care time: n/a ?  ? ?Angela Carpen Kyaire Gruenewald, PA-C ?[facility] ?See Amion for pager ?If no response to pager , please call [phone] until 7pm ?After 7:00 pm call Elink  [phone] ? ? ? ?

## 2021-04-10 NOTE — Evaluation (Signed)
Physical Therapy Evaluation ?Patient Details ?Name: Angela Jenkins ?MRN: [id number] ?DOB: 04/27/1945 ?Today's Date: 04/10/2021 ? ?History of Present Illness ? 75 y.o. female presented to ED for episode of aphasia, right facial droop and right-sided weakness.  Symptoms not improved on EMS arrival and nearly resolved in ER.  Slight clumsiness right hand and right leg.  CT no acute finding. CT head and neck done showed left ICA occlusion and likely acute left M2 occlusion.MRI showed punctate left parietal infarct. Several hours later patient's symptoms getting worse with right-sided weakness and aphasia.  Repeat CTA showed left ICA partially recannulized with left M1 occlusion. Mechanical thrombectomy performed with TICI3 left M1 and left ICA angioplasty with 30% residual stenosis.  MRI: left BG and caudate head infarct with scattered left MCA punctate infarcts, and mild petechial hemorrhage and SAH. 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 Patient presents with decreased mobility due to mild R side sensory and strength deficit impacting mobility, balance and safety.  She progressed during session from min A to minguard for most mobility.  She has her spouse to assist at d/c and lives in one level home with no steps to enter.  Feel she will benefit from skilled PT in the acute setting and from follow up outpatient PT at d/c.    ?   ? ?Recommendations for follow up therapy are one component of a multi-disciplinary discharge planning process, led by the attending physician.  Recommendations may be updated based on patient status, additional functional criteria and insurance authorization. ? ?Follow Up Recommendations Outpatient PT ? ?  ?Assistance Recommended at Discharge Intermittent Supervision/Assistance  ?Patient can return home with the following ? A little help with walking and/or transfers;A little help with bathing/dressing/bathroom;Assistance with  cooking/housework;Assist for transportation;Help with stairs or ramp for entrance ? ?  ?Equipment Recommendations Rolling walker (2 wheels)  ?Recommendations for Other Services ?    ?  ?Functional Status Assessment Patient has had a recent decline in their functional status and demonstrates the ability to make significant improvements in function in a reasonable and predictable amount of time.  ? ?  ?Precautions / Restrictions Precautions ?Precautions: Fall ?Precaution Comments: legally blind in L eye ?Restrictions ?Weight Bearing Restrictions: No  ? ?  ? ?Mobility ? Bed Mobility ?Overal bed mobility: Independent ?  ?  ?  ?  ?  ?  ?  ?  ? ?Transfers ?Overall transfer level: Needs assistance ?Equipment used: 1 person hand held assist ?Transfers: Sit to/from Stand ?Sit to Stand: Min guard, Min assist ?  ?  ?  ?  ?  ?General transfer comment: assist initially with posterior bias, improved during session with less assist for sit to stand/stand to sit on toilet ?  ? ?Ambulation/Gait ?Ambulation/Gait assistance: Min guard, Min assist ?Gait Distance (Feet): 150 Feet ?Assistive device: 1 person hand held assist ?Gait Pattern/deviations: Step-through pattern, Decreased dorsiflexion - right, Decreased stride length ?  ?  ?  ?General Gait Details: initially HHA reliant on support from PT, progressed during session needing less support and able to ambulate without HHA from bathroom to sink to wash hands ? ?Stairs ?  ?  ?  ?  ?  ? ?Wheelchair Mobility ?  ? ?Modified Rankin (Stroke Patients Only) ?Modified Rankin (Stroke Patients Only) ?Pre-Morbid Rankin Score: No symptoms ?Modified Rankin: Moderately severe disability ? ?  ? ?Balance Overall balance assessment: Mild deficits observed, not formally tested ?  ?  ?  ?  ?  ?  ?  ?  ?  ?  ?  ?  ?  ?  ?  ?  ?  ?  ?   ? ? ? ?  Pertinent Vitals/Pain Pain Assessment ?Pain Assessment: No/denies pain  ? ? ?Home Living Family/patient expects to be discharged to:: Private residence ?Living  Arrangements: Spouse/significant other ?Available Help at Discharge: Family;Available 24 hours/day ?Type of Home: House ?Home Access: Level entry ?  ?  ?  ?Home Layout: One level ?Home Equipment: None;Shower seat - built in ?   ?  ?Prior Function Prior Level of Function : Independent/Modified Independent ?  ?  ?  ?  ?  ?  ?  ?  ?  ? ? ?Hand Dominance  ? Dominant Hand: Right ? ?  ?Extremity/Trunk Assessment  ? Upper Extremity Assessment ?Upper Extremity Assessment: Defer to OT evaluation ?  ? ?Lower Extremity Assessment ?Lower Extremity Assessment: RLE deficits/detail ?RLE Deficits / Details: AROM WFL, strength grossly 4/5 throughout except ankle DF 3+/5 ?RLE Sensation: decreased light touch ?  ? ?Cervical / Trunk Assessment ?Cervical / Trunk Assessment: Normal  ?Communication  ? Communication: No difficulties  ?Cognition Arousal/Alertness: Awake/alert ?Behavior During Therapy: [facility] for tasks assessed/performed ?Overall Cognitive Status: Within Functional Limits for tasks assessed ?  ?  ?  ?  ?  ?  ?  ?  ?  ?  ?  ?  ?  ?  ?  ?  ?  ?  ?  ? ?  ?General Comments General comments (skin integrity, edema, etc.): spouse present and supportive, MD in end of session and discussed follow up outpatient PT ? ?  ?Exercises    ? ?Assessment/Plan  ?  ?PT Assessment Patient needs continued PT services  ?PT Problem List Decreased strength;Decreased mobility;Impaired sensation;Decreased balance ? ?   ?  ?PT Treatment Interventions DME instruction;Therapeutic activities;Gait training;Therapeutic exercise;Patient/family education;Balance training;Functional mobility training   ? ?PT Goals (Current goals can be found in the Care Plan section)  ?Acute Rehab PT Goals ?Patient Stated Goal: to return home ?PT Goal Formulation: With patient/family ?Time For Goal Achievement: 04/17/21 ?Potential to Achieve Goals: Good ? ?  ?Frequency Min 4X/week ?  ? ? ?Co-evaluation   ?  ?  ?  ?  ? ? ?  ?AM-PAC PT "6 Clicks" Mobility  ?Outcome Measure Help  needed turning from your back to your side while in a flat bed without using bedrails?: None ?Help needed moving from lying on your back to sitting on the side of a flat bed without using bedrails?: None ?Help needed moving to and from a bed to a chair (including a wheelchair)?: A Little ?Help needed standing up from a chair using your arms (e.g., wheelchair or bedside chair)?: A Little ?Help needed to walk in hospital room?: A Little ?Help needed climbing 3-5 steps with a railing? : Total ?6 Click Score: 18 ? ?  ?End of Session Equipment Utilized During Treatment: Gait belt ?Activity Tolerance: Patient tolerated treatment well ?Patient left: in chair;with call bell/phone within reach;with family/visitor present ?  ?PT Visit Diagnosis: Other abnormalities of gait and mobility (R26.89);Muscle weakness (generalized) (M62.81) ?  ? ?Time:  -  ?  ? ? ?Charges:   PT Evaluation ?$PT Eval Moderate Complexity: 1 Mod ?  ?  ?   ? ? ?Magda Kiel, PT ?Acute Rehabilitation Services ?TMAUQ:[phone] ?[phone] ?04/10/2021 ? ? ?Reginia Naas ?04/10/2021, 1:48 PM ? ?

## 2021-04-10 NOTE — Progress Notes (Signed)
Referring Physician(s): Dr. Lavera Guise  Supervising Physician: Luanne Bras  Patient Status:  [facility] - In-pt  Chief Complaint:  Code Stroke s/p LT MCA M 1 seg with x1 pass with 60mx 40 mm solitaire X retriever and contact aspiration achieving a TICI 3 revascularization on 3.4.23 by Dr. Dr. SLuanne Bras  Subjective:  Patient sitting up in bed reading. Denies any neurological deficits at this time.   Allergies: Brimonidine  Medications: Prior to Admission medications   Medication Sig Start Date End Date Taking? Authorizing Provider  amLODipine (NORVASC) 5 MG tablet Take 5 mg by mouth daily. 02/01/21  Yes [provider]  denosumab (PROLIA) 60 MG/ML SOSY injection Inject 60 mg into the skin every 6 (six) months.   Yes [provider]  dorzolamide-timolol (COSOPT) 22.3-6.8 MG/ML ophthalmic solution Place 1 drop into both eyes 2 (two) times daily.   Yes [provider]  hydrochlorothiazide (HYDRODIURIL) 12.5 MG tablet Take 12.5 mg by mouth daily. 02/01/21  Yes [provider]  latanoprost (XALATAN) 0.005 % ophthalmic solution Place 1 drop into both eyes at bedtime.   Yes [provider]  losartan (COZAAR) 100 MG tablet Take 100 mg by mouth daily. 03/30/21  Yes [provider]  VITAMIN D PO Take 20,000 Units by mouth once a week. Wednesdays   Yes [provider]     Vital Signs: BP 121/63    Pulse 67    Temp 98.1 F (36.7 C) (Oral)    Resp 14    Ht '5\' 2"'$  (1.575 m)    Wt 123 lb 0.3 oz (55.8 kg)    SpO2 97%    BMI 22.50 kg/m   Physical Exam Vitals and nursing note reviewed.  Constitutional:      Appearance: She is well-developed.  HENT:     Head: Normocephalic and atraumatic.  Eyes:     Conjunctiva/sclera: Conjunctivae normal.  Cardiovascular:     Rate and Rhythm: Normal rate and regular rhythm.  Pulmonary:     Effort: Pulmonary effort is normal.  Musculoskeletal:        General: Normal range of motion.      Cervical back: Normal range of motion.  Skin:    General: Skin is warm.  Neurological:     Mental Status: She is alert and oriented to person, place, and time.     Comments: Alert, aware and oriented X 3 Speech and comprehension is intact.  No facial droop noted Tongue midline Can spontaneously move all 4 extremities. Hand grip strength equal bilaterally. Fine motor and coordination  intact.  Comprehension and fluency are normal.  Judgment and insight normal  Gait not assessed Romberg not assessed Heel to toe not assessed Distal pulses not assessed     Imaging: MR BRAIN WO CONTRAST  Addendum Date: 04/09/2021   ADDENDUM REPORT: 04/09/2021 11:36 ADDENDUM: These results were called by telephone at the time of interpretation on 04/09/2021 at 11:36 am to provider Dr. XErlinda Hong who verbally acknowledged these results. Electronically Signed   By: KKellie SimmeringD.O.   On: 04/09/2021 11:36   Result Date: 04/09/2021 CLINICAL DATA:  Provided history: Neuro deficit, acute, stroke suspected. EXAM: MRI HEAD WITHOUT CONTRAST TECHNIQUE: Multiplanar, multiecho pulse sequences of the brain and surrounding structures were obtained without intravenous contrast. COMPARISON:  Prior CTA head/neck examinations 04/08/2021, prior noncontrast head CT examinations 04/08/2021, prior brain MRI 04/08/2021. Postprocedural head CT performed yesterday. FINDINGS: Brain: Mild-to-moderate generalized cerebral and cerebellar atrophy. New  from the prior brain MRI of 04/08/2021, there are acute infarcts involving much of the left caudate and lentiform nuclei, and likely small portions of the left internal capsule. Slight regional mass effect without significant ventricular effacement or midline shift. Mild petechial hemorrhage within the left basal ganglia. There are also new small cortically based infarcts within the left frontal and parietal lobes (left MCA vascular territory). There are a few punctate foci of petechial hemorrhage  within the high left parietal lobe, also new from the prior MRI. There is T2 FLAIR hyperintense signal abnormality and susceptibility-weighted signal loss along portions of the left frontal, parietal and temporal lobes, compatible with small-volume acute subarachnoid hemorrhage. Redemonstrated small chronic cortical infarct within the lateral left frontoparietal lobes. Mild multifocal T2 FLAIR hyperintense signal abnormality within the cerebral white matter, nonspecific but compatible with chronic small vessel ischemic disease. No evidence of an intracranial mass. No evidence of hydrocephalus. Vascular: Maintained flow voids within the proximal large arterial vessels. Skull and upper cervical spine: No focal suspicious marrow lesion. Sinuses/Orbits: Visualized orbits show no acute finding. Prior right ocular lens replacement. Mild mucosal thickening within the bilateral ethmoid, right sphenoid and bilateral maxillary sinuses. Small mucous retention cyst within the left sphenoid sinus. IMPRESSION: New from the prior brain MRI of 04/08/2021, there are acute infarcts involving much of the left caudate and lentiform nuclei, as well as small portions of the left internal capsule. Slight regional mass effect without ventricular effacement or midline shift. Mild petechial hemorrhage within the left basal ganglia. There are several small acute cortically-based infarcts within the left frontal and parietal lobes, which are also new from the prior MRI (left MCA vascular territory). Additionally, there are a few new punctate foci of petechial hemorrhage within the high left parietal lobe. Signal abnormality along portions of the left frontal, parietal and temporal lobes, compatible with small-volume acute subarachnoid hemorrhage. Chronic findings without interval change, as described. Paranasal sinus disease, as outlined. Electronically Signed: By: Kellie Simmering D.O. On: 04/09/2021 11:31   MR BRAIN WO CONTRAST  Result Date:  04/08/2021 CLINICAL DATA:  Provided history: Neuro deficit, acute, stroke suspected. EXAM: MRI HEAD WITHOUT CONTRAST TECHNIQUE: Multiplanar, multiecho pulse sequences of the brain and surrounding structures were obtained without intravenous contrast. COMPARISON:  CT angiogram head/neck and non-contrast head CT performed earlier today 04/08/2021. FINDINGS: Brain: Mild-to-moderate generalized cerebral and cerebellar atrophy. Punctate acute or cortical infarct within the left parietal lobe (series 2, image 41 (left MCA vascular territory). Small chronic cortical infarct within the lateral left frontoparietal lobes (series 6, image 26). Mild multifocal T2 FLAIR hyperintense signal abnormality within the cerebral white matter, nonspecific but compatible with chronic small vessel ischemic disease. No evidence of an intracranial mass. No chronic intracranial blood products. No extra-axial fluid collection. No midline shift. Vascular: Signal abnormality within the petrous and proximal cavernous left ICA at site of known vessel occlusion. Focus of susceptibility weighted signal loss within the left sylvian fissure, likely reflecting thrombus within an occluded proximal M2 left MCA vessel (series 7, image 53). Skull and upper cervical spine: No focal suspicious marrow lesion. Sinuses/Orbits: Visualized orbits show no acute finding. Prior left ocular lens replacement. Mild mucosal thickening within the bilateral maxillary sinuses. Trace mucosal thickening within the bilateral ethmoid sinuses. IMPRESSION: Punctate acute cortical infarct within the left parietal lobe (left MCA vascular territory). Small chronic cortically-based infarct within the lateral left frontoparietal lobes (left MCA vascular territory). Mild chronic small vessel ischemic changes within the cerebral white matter. Signal  abnormality within the petrous and proximal cavernous segments of the left ICA, at site of known vessel occlusion. Focus of  susceptibility-weighted signal loss within the left sylvian fissure, likely reflecting thrombus within an occluded proximal M2 left MCA vessel (as was demonstrated on the CTA head performed earlier today). Mild-to-moderate generalized cerebral and cerebellar atrophy. Paranasal sinus disease, as described. Electronically Signed   By: Kellie Simmering D.O.   On: 04/08/2021 16:19   Portable Chest x-ray  Result Date: 04/08/2021 CLINICAL DATA:  Endotracheal tube placement. EXAM: PORTABLE CHEST 1 VIEW COMPARISON:  07/15/2011. FINDINGS: The heart size and mediastinal contours are within normal limits. No consolidation, effusion, or pneumothorax. An endotracheal tube terminates 3.1 cm above the carina. An enteric tube terminates in the stomach. Multiple old rib fractures are noted on the right with bone callus formation. IMPRESSION: 1. No active disease. 2. Endotracheal and enteric tubes as described above. Electronically Signed   By: Brett Fairy M.D.   On: 04/08/2021 23:07   ECHOCARDIOGRAM COMPLETE  Result Date: 04/08/2021    ECHOCARDIOGRAM REPORT   Patient Name:   TAILEY TOP Date of Exam: 04/08/2021 Medical Rec #:  [id number]   Height:       63.0 in Accession #:    [id number]  Weight:       121.3 lb Date of Birth:  1946/01/21   BSA:          1.563 m Patient Age:    49 years    BP:           134/72 mmHg Patient Gender: F           HR:           87 bpm. Exam Location:  Inpatient Procedure: 2D Echo, Cardiac Doppler and Color Doppler Indications:    Stroke  History:        Patient has no prior history of Echocardiogram examinations.  Sonographer:    Merrie Roof RDCS Referring Phys: [id number] [place]  1. Left ventricular ejection fraction, by estimation, is 60 to 65%. The left ventricle has normal function. The left ventricle has no regional wall motion abnormalities. Left ventricular diastolic parameters were normal.  2. Right ventricular systolic function is normal. The right ventricular size is normal.  There is normal pulmonary artery systolic pressure.  3. The mitral valve is normal in structure. No evidence of mitral valve regurgitation. No evidence of mitral stenosis.  4. The aortic valve is normal in structure. Aortic valve regurgitation is not visualized. No aortic stenosis is present. FINDINGS  Left Ventricle: Left ventricular ejection fraction, by estimation, is 60 to 65%. The left ventricle has normal function. The left ventricle has no regional wall motion abnormalities. The left ventricular internal cavity size was normal in size. There is  no left ventricular hypertrophy. Left ventricular diastolic parameters were normal. Right Ventricle: The right ventricular size is normal. Right vetricular wall thickness was not well visualized. Right ventricular systolic function is normal. There is normal pulmonary artery systolic pressure. The tricuspid regurgitant velocity is 2.17 m/s, and with an assumed right atrial pressure of 3 mmHg, the estimated right ventricular systolic pressure is 69.4 mmHg. Left Atrium: Left atrial size was normal in size. Right Atrium: Right atrial size was normal in size. Pericardium: There is no evidence of pericardial effusion. Mitral Valve: The mitral valve is normal in structure. No evidence of mitral valve regurgitation. No evidence of mitral valve stenosis. Tricuspid Valve: The tricuspid valve is normal in  structure. Tricuspid valve regurgitation is trivial. Aortic Valve: The aortic valve is normal in structure. Aortic valve regurgitation is not visualized. No aortic stenosis is present. Aortic valve mean gradient measures 3.0 mmHg. Aortic valve peak gradient measures 6.2 mmHg. Aortic valve area, by VTI measures 1.78 cm. Pulmonic Valve: The pulmonic valve was not well visualized. Pulmonic valve regurgitation is not visualized. Aorta: The aortic root and ascending aorta are structurally normal, with no evidence of dilitation. IAS/Shunts: The atrial septum is grossly normal.   LEFT VENTRICLE PLAX 2D LVIDd:         4.00 cm   Diastology LVIDs:         2.70 cm   LV e' medial:    5.66 cm/s LV PW:         0.90 cm   LV E/e' medial:  11.4 LV IVS:        1.00 cm   LV e' lateral:   7.07 cm/s LVOT diam:     1.70 cm   LV E/e' lateral: 9.1 LV SV:         45 LV SV Index:   29 LVOT Area:     2.27 cm  RIGHT VENTRICLE RV Basal diam:  3.10 cm LEFT ATRIUM             Index        RIGHT ATRIUM          Index LA diam:        2.70 cm 1.73 cm/m   RA Area:     9.96 cm LA Vol (A2C):   40.1 ml 25.65 ml/m  RA Volume:   18.70 ml 11.96 ml/m LA Vol (A4C):   34.5 ml 22.07 ml/m LA Biplane Vol: 37.7 ml 24.12 ml/m  AORTIC VALVE AV Area (Vmax):    1.76 cm AV Area (Vmean):   1.73 cm AV Area (VTI):     1.78 cm AV Vmax:           124.00 cm/s AV Vmean:          87.200 cm/s AV VTI:            0.254 m AV Peak Grad:      6.2 mmHg AV Mean Grad:      3.0 mmHg LVOT Vmax:         96.00 cm/s LVOT Vmean:        66.500 cm/s LVOT VTI:          0.199 m LVOT/AV VTI ratio: 0.78  AORTA Ao Root diam: 2.80 cm MITRAL VALVE                TRICUSPID VALVE MV Area (PHT): 3.43 cm     TR Peak grad:   18.8 mmHg MV Decel Time: 221 msec     TR Vmax:        217.00 cm/s MV E velocity: 64.30 cm/s MV A velocity: 104.00 cm/s  SHUNTS MV E/A ratio:  0.62         Systemic VTI:  0.20 m                             Systemic Diam: 1.70 cm Mertie Moores MD Electronically signed by Mertie Moores MD Signature Date/Time: 04/08/2021/5:24:55 PM    Final    CT HEAD CODE STROKE WO CONTRAST  Result Date: 04/08/2021 CLINICAL DATA:  Code stroke. Provided history: Neuro deficit, acute, stroke suspected. EXAM:  CT HEAD WITHOUT CONTRAST TECHNIQUE: Contiguous axial images were obtained from the base of the skull through the vertex without intravenous contrast. RADIATION DOSE REDUCTION: This exam was performed according to the departmental dose-optimization program which includes automated exposure control, adjustment of the mA and/or kV according to patient size  and/or use of iterative reconstruction technique. COMPARISON:  Brain MRI 04/08/2021. Non-contrast head CT and CT angiogram head/neck performed earlier today 04/08/2021. FINDINGS: Brain: Mild-to-moderate generalized cerebral atrophy. Comparatively mild cerebellar atrophy. A punctate acute cortical infarct within the left parietal lobe was better appreciated on the brain MRI performed earlier today. A small chronic cortical infarct within the lateral left frontoparietal lobes was also better appreciated on this prior examination. Mild patchy and ill-defined hypoattenuation within the cerebral white matter, nonspecific but compatible with chronic small vessel ischemic disease. There is no acute intracranial hemorrhage. No extra-axial fluid collection. No evidence of an intracranial mass. No midline shift. Vascular: Occlusion of the left ICA within the neck and at the petrous segment was demonstrated on the CTA head/neck performed earlier today. Occlusion of a proximal M2 left MCA vessel was also demonstrated on the head CTA performed earlier today. Atherosclerotic calcifications. Skull: Normal. Negative for fracture or focal lesion. Sinuses/Orbits: Visualized orbits show no acute finding. Mild mucosal thickening within the bilateral maxillary sinuses. ASPECTS [facility] Stroke Program Early CT Score) - Ganglionic level infarction (caudate, lentiform nuclei, internal capsule, insula, M1-M3 cortex): 7 - Supraganglionic infarction (M4-M6 cortex): 3 Total score (0-10 with 10 being normal): 10 These results were communicated to Dr. Lorrin Goodell At 6:58 pmon 3/4/2023by text page via the [facility] messaging system. IMPRESSION: A punctate acute cortical infarct within the left parietal lobe was better appreciated on the brain MRI performed earlier today. No evidence of acute intracranial hemorrhage. Chronic findings without appreciable interval change from the brain MRI performed earlier today, as described. Mild mucosal thickening  within the bilateral maxillary sinuses. Electronically Signed   By: Kellie Simmering D.O.   On: 04/08/2021 18:58   CT HEAD CODE STROKE WO CONTRAST  Result Date: 04/08/2021 CLINICAL DATA:  Code stroke. Neuro deficit, acute, stroke suspected. Right-sided weakness. EXAM: CT HEAD WITHOUT CONTRAST TECHNIQUE: Contiguous axial images were obtained from the base of the skull through the vertex without intravenous contrast. RADIATION DOSE REDUCTION: This exam was performed according to the departmental dose-optimization program which includes automated exposure control, adjustment of the mA and/or kV according to patient size and/or use of iterative reconstruction technique. COMPARISON:  No pertinent prior exams available for comparison. FINDINGS: Brain: Mild-to-moderate generalized cerebral and cerebellar atrophy. Mild ill-defined hypoattenuation within the periventricular white matter, nonspecific but compatible chronic small vessel ischemic disease. There is no acute intracranial hemorrhage. No demarcated cortical infarct. No extra-axial fluid collection. No evidence of an intracranial mass. No midline shift. Vascular: No hyperdense vessel.  Atherosclerotic calcifications. Skull: Normal. Negative for fracture or focal lesion. Sinuses/Orbits: Visualized orbits show no acute finding. No significant paranasal sinus disease at the imaged levels. ASPECTS [facility] Stroke Program Early CT Score) - Ganglionic level infarction (caudate, lentiform nuclei, internal capsule, insula, M1-M3 cortex): 7 - Supraganglionic infarction (M4-M6 cortex): 3 Total score (0-10 with 10 being normal): 10 These results were communicated to Dr. Erlinda Hong At 12:33 pmon 3/4/2023by text page via the [facility] messaging system. IMPRESSION: No evidence of acute intracranial abnormality. Mild chronic small vessel given changes within the cerebral white matter. Mild-to-moderate generalized parenchymal atrophy. Electronically Signed   By: Kellie Simmering D.O.   On:  04/08/2021 12:33  CT ANGIO HEAD NECK W WO CM W PERF  Result Date: 04/08/2021 CLINICAL DATA:  Code stroke. EXAM: CT ANGIOGRAPHY HEAD AND NECK CT PERFUSION BRAIN TECHNIQUE: Multidetector CT imaging of the head and neck was performed using the standard protocol during bolus administration of intravenous contrast. Multiplanar CT image reconstructions and MIPs were obtained to evaluate the vascular anatomy. Carotid stenosis measurements (when applicable) are obtained utilizing NASCET criteria, using the distal internal carotid diameter as the denominator. Multiphase CT imaging of the brain was performed following IV bolus contrast injection. Subsequent parametric perfusion maps were calculated using RAPID software. RADIATION DOSE REDUCTION: This exam was performed according to the departmental dose-optimization program which includes automated exposure control, adjustment of the mA and/or kV according to patient size and/or use of iterative reconstruction technique. CONTRAST:  141m OMNIPAQUE IOHEXOL 350 MG/ML SOLN COMPARISON:  Noncontrast head CT examinations, CT angiogram head/neck and brain MRI performed earlier today 04/08/2021. FINDINGS: CTA NECK FINDINGS Aortic arch: Standard aortic branching. Atherosclerotic plaque within the proximal major branch vessels of the neck. Streak and beam hardening artifact arising from a dense right-sided contrast bolus partially obscures the right subclavian artery. Within this limitation, there is no hemodynamically significant innominate or proximal subclavian artery stenosis. Right carotid system: CCA and ICA patent within the neck without significant stenosis (50% or greater). Mild atherosclerotic plaque about the carotid bifurcation. Left carotid system: The common carotid artery is patent. Atherosclerotic plaque about the carotid bifurcation. There has been interval reconstitution of flow within the cervical left ICA, although this vessel remains diminutive. Additionally,  there is eccentric hypodensity within the vessel lumen at the ICA origin (which may reflect soft plaque or thrombus) with resultant severe focal stenosis at this site. Vertebral arteries: The vertebral arteries are codominant and patent within the neck without stenosis. Skeleton: Cervical spondylosis. No acute bony abnormality or aggressive osseous lesion. Grade 1 anterolisthesis at C2-C3, C3-C4 and C4-C5. Other neck: Subcentimeter thyroid nodules, not meeting consensus criteria for ultrasound follow-up based on size. Upper chest: No consolidation within the imaged lung apices. Review of the MIP images confirms the above findings CTA HEAD FINDINGS Anterior circulation: The intracranial left ICA is now patent throughout. However, there is persistent severe narrowing of the petrous and proximal cavernous segments. New from the prior exam, subtle linear thrombus is questioned within the distal cavernous left ICA (for instance as seen on series 7, images 129 and 130). 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 There is some reconstitution of enhancement within M2 and more distal left MCA vessels. The intracranial right ICA is patent. Mild nonstenotic calcified plaque within this vessel. The M1 right middle cerebral artery is patent. No right M2 proximal branch occlusion or high-grade proximal stenosis is identified. The anterior cerebral arteries are patent. Posterior circulation: The intracranial vertebral arteries are patent. The basilar artery is patent. Short segment fenestration within the proximal basilar artery. The posterior cerebral arteries are patent. Posterior communicating arteries are diminutive or absent bilaterally. Distal branch atherosclerotic irregularity of the right PCA. Venous sinuses: Within the limitations of contrast timing, no convincing thrombus. Anatomic variants: As described. Review of the MIP images confirms the above findings CT  Brain Perfusion Findings: CBF (<30%) Volume: 674mPerfusion (Tmax>6.0s) volume: 8690mismatch Volume: 24m16mfarction Location:Left MCA vascular territory These results were called by telephone at the time of interpretation on 04/08/2021 at 7:03 pm to provider SALMVa [facility] verbally acknowledged these results.  IMPRESSION: CTA neck: Since the CTA performed earlier today, there has been interval reconstitution of flow within the cervical left ICA. However, this vessel remains diminutive. Additionally, there is eccentric hypodensity within the vessel lumen at the left ICA origin (which may reflect soft plaque or thrombus) resulting in severe focal stenosis at this site. CTA head: The intracranial left internal carotid artery is now patent. However, there is persistent severe narrowing of the petrous and proximal cavernous segments. New from the prior exam, subtle linear thrombus is questioned within the distal cavernous left ICA. 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 CT perfusion head: The perfusion software identifies core infarcts within the left MCA vascular territory totaling 6 mL. The perfusion software identifies critically hypoperfused parenchyma within the left MCA vascular territory totaling 86 mL (utilizing the Tmax>6 seconds threshold). Reported mismatch volume: 80 mL. Electronically Signed   By: Kellie Simmering D.O.   On: 04/08/2021 19:23   CT ANGIO HEAD NECK W WO CM (CODE STROKE)  Result Date: 04/08/2021 CLINICAL DATA:  Provided history: Code stroke. Right-sided weakness. EXAM: CT ANGIOGRAPHY HEAD AND NECK TECHNIQUE: Multidetector CT imaging of the head and neck was performed using the standard protocol during bolus administration of intravenous contrast. Multiplanar CT image reconstructions and MIPs were obtained to evaluate the vascular anatomy. Carotid stenosis measurements (when applicable) are obtained utilizing NASCET criteria,  using the distal internal carotid diameter as the denominator. RADIATION DOSE REDUCTION: This exam was performed according to the departmental dose-optimization program which includes automated exposure control, adjustment of the mA and/or kV according to patient size and/or use of iterative reconstruction technique. CONTRAST:  46m OMNIPAQUE IOHEXOL 350 MG/ML SOLN COMPARISON:  Noncontrast head CT performed earlier today 04/08/2021. FINDINGS: CTA NECK FINDINGS Aortic arch: Standard aortic branching. Atherosclerotic plaque within the visualized aortic arch and proximal major branch vessels of the neck. Streak and beam hardening artifact arising from a dense right-sided contrast bolus partially obscures the right subclavian artery. Within this limitation, there is no appreciable hemodynamically significant stenosis within the innominate or proximal subclavian arteries. Right carotid system: CCA and ICA patent within the neck without significant stenosis (50% or greater). Mild atherosclerotic plaque about the carotid bifurcation. Left carotid system: The common carotid artery is patent. 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 Atherosclerotic plaque within the distal CCA and about the carotid bifurcation. Vertebral arteries: Vertebral arteries codominant and patent within the neck without stenosis. Skeleton: Cervical spondylosis. No acute bony abnormality or aggressive osseous lesion. Grade 1 anterolisthesis at C2-C3, C3-C4 and C4-C5. Other neck: Subcentimeter thyroid nodules, not meeting consensus criteria for ultrasound follow-up based on size. Upper chest: No consolidation within the imaged lung apices. Review of the MIP images confirms the above findings CTA HEAD FINDINGS Anterior circulation: The intracranial right ICA is patent. 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 There is reconstitution of enhancement within  the intracranial left ICA beginning at the cavernous segment. The M1 middle cerebral arteries are patent. There is occlusion of a proximal M2 left MCA vessel (series 12, image 28). The anterior cerebral arteries are patent. No intracranial aneurysm is identified. Posterior circulation: The intracranial vertebral arteries are patent. The basilar artery is patent. Short segment fenestration within the proximal basilar artery. The posterior cerebral arteries are patent. Posterior communicating arteries are diminutive or absent bilaterally. Venous sinuses: Within the limitations of  contrast timing, no convincing thrombus. Anatomic variants: As described. Review of the MIP images confirms the above findings CTA head impression called by telephone at the time of interpretation on 04/08/2021 at 1:03 pm to provider [facility] , who verbally acknowledged these results. IMPRESSION: CTA neck: 1. The left common carotid artery is patent. 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 2. The right common carotid, right internal carotid and bilateral vertebral arteries are patent within the neck without hemodynamically significant stenosis. 3.  Aortic Atherosclerosis (ICD10-I70.0). CTA head: 1. 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 There is reconstitution of enhancement within the intracranial left ICA beginning at the cavernous segment. 2. There is occlusion of a proximal M2 left MCA vessel (series 12, image 28). Electronically Signed   By: Kellie Simmering D.O.   On: 04/08/2021 13:04    Labs:  CBC: Recent Labs    04/08/21 1214 04/08/21 1230 04/08/21 2228 04/08/21 2229 04/09/21 0529 04/10/21 0409  WBC 8.2  --   --  10.4 14.2* 9.9  HGB 15.9*   < > 13.9 15.0 13.4 11.9*  HCT 45.8   < > 41.0 43.0 37.9 34.7*  PLT 317  --   --  326 325 262   < > = values in this interval not displayed.    COAGS: Recent Labs     04/08/21 1214  INR 1.0  APTT 26    BMP: Recent Labs    04/08/21 1214 04/08/21 1230 04/08/21 2228 04/08/21 2229 04/09/21 0529 04/10/21 0409  NA 134* 133* 135  --  136 140  K 4.3 4.3 3.0*  --  3.4* 2.9*  CL 97* 100  --   --  102 105  CO2 22  --   --   --  23 26  GLUCOSE 110* 105*  --   --  126* 102*  BUN 12 14  --   --  6* 6*  CALCIUM 10.3  --   --   --  8.1* 8.0*  CREATININE 0.73 0.70  --  0.51 0.61 0.71  GFRNONAA >60  --   --  >60 >60 >60    LIVER FUNCTION TESTS: Recent Labs    04/08/21 1214  BILITOT 0.6  AST 22  ALT 16  ALKPHOS 54  PROT 7.5  ALBUMIN 4.4    Assessment and Plan:  76 y.o. female. Presented on 3.4.23 with right sided weakness and aphasia. Code stroke.  S/p RT CFA approach.Findings. 1 Occluded  LT MCA M 1 seg with x1 pass with 25mx 40 mm solitaire X retriever and contact aspiration achieving a TICI 3 revascularization. [place] angioplasty of pre occlusive Lt ICA prox  to stable 70% patency post angioplasty. Post CT brain shows contrast blush in the Lt putamen. Also mild Lt high parietal subarachnoid hyper attenuation ? Contrast +/_ hemorrhage. MRI brain from 3.5.23 reads New from the prior brain MRI of 04/08/2021, there are acute infarct involving much of the left caudate and lentiform nuclei, as well as small portions of the left internal capsule. Slight regional mass effect without ventricular effacement or midline shift. Mild petechial hemorrhage within the left basal ganglia. There are several small acute cortically-based infarcts within the left frontal and parietal lobes, which are also new from the prior MRI (left MCA vascular territory). Additionally, there are a few new punctate foci of petechial hemorrhage within the high left parietal lobe. Signal  abnormality along portions of the left frontal, parietal and temporal lobes, compatible with small-volume acute subarachnoid hemorrhage  Now extubated. Patient seen at bedside with Dr. Luanne Bras. Alert and oriented reading a book. States that she has been out of bed on 3.5.23 and is now eating a drinking with plans to transfer out of ICU today. Potassium 2.9 orders in for potassium oral supplement. Patient on 81 mg of ASA and Plavix.  Recommend US carotid to be performed on 3.8.23 or 05-07-21 prior to patient being discharged.   IR will continue to follow along - plans per Neurology.   Electronically Signed: Jacqualine Mau, NP 04/10/2021, 11:13 AM   I spent a total of 15 Minutes at the patient's bedside AND on the patient's hospital floor or unit, greater than 50% of which was counseling/coordinating care for LT MCA M 1 seg with x1 pass with 5mx 40 mm solitaire X retriever and contact aspiration achieving a TICI 3 revascularization.

## 2021-04-10 NOTE — Evaluation (Signed)
Occupational Therapy Evaluation ?Patient Details ?Name: Angela Jenkins ?MRN: [id number] ?DOB: Jun 16, 1945 ?Today's Date: 04/10/2021 ? ? ?History of Present Illness 76 y.o. female presented to ED for episode of aphasia, right facial droop and right-sided weakness.  Symptoms not improved on EMS arrival and nearly resolved in ER.  Slight clumsiness right hand and right leg.  CT no acute finding. CT head and neck done showed left ICA occlusion and likely acute left M2 occlusion.MRI showed punctate left parietal infarct. Several hours later patient's symptoms getting worse with right-sided weakness and aphasia.  Repeat CTA showed left ICA partially recannulized with left M1 occlusion. Mechanical thrombectomy performed with TICI3 left M1 and left ICA angioplasty with 30% residual stenosis.  MRI: left BG and caudate head infarct with scattered left MCA punctate infarcts, and mild petechial hemorrhage and SAH. 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 ?Clinical Impression ?  ?This 76 yo female admitted and underwent above presents to acute OT at an overall minguard A level when up on her feet. She has her husband at home with her and he can A as much as needed. She will benefit from acute OT without need for follow up.  ?   ? ?Recommendations for follow up therapy are one component of a multi-disciplinary discharge planning process, led by the attending physician.  Recommendations may be updated based on patient status, additional functional criteria and insurance authorization.  ? ?Follow Up Recommendations ? No OT follow up  ?  ?Assistance Recommended at Discharge Intermittent Supervision/Assistance  ?Patient can return home with the following A little help with bathing/dressing/bathroom;A little help with walking and/or transfers;Assistance with cooking/housework;Assist for transportation;Help with stairs or ramp for entrance ? ?  ?Functional Status Assessment ? Patient has had a recent  decline in their functional status and demonstrates the ability to make significant improvements in function in a reasonable and predictable amount of time.  ?Equipment Recommendations ? None recommended by OT  ?  ?   ?Precautions / Restrictions Precautions ?Precautions: Fall ?Precaution Comments: legally blind in L eye ?Restrictions ?Weight Bearing Restrictions: No  ? ?  ? ?Mobility Bed Mobility ?Overal bed mobility: Independent ?  ?  ?  ?  ?  ?  ?  ?  ? ?Transfers ?Overall transfer level: Needs assistance ?Equipment used: 1 person hand held assist ?Transfers: Sit to/from Stand ?Sit to Stand: Min guard, Min assist ?  ?  ?  ?  ?  ?  ?  ? ?  ?Balance Overall balance assessment: Mild deficits observed, not formally tested ?  ?  ?  ?  ?  ?  ?  ?  ?  ?  ?  ?  ?  ?  ?  ?  ?  ?  ?   ? ?ADL either performed or assessed with clinical judgement  ? ?ADL Overall ADL's : Needs assistance/impaired ?Eating/Feeding: Independent;Sitting ?  ?Grooming: Min guard;Standing ?  ?Upper Body Bathing: Set up;Sitting ?  ?Lower Body Bathing: Min guard;Sit to/from stand ?  ?Upper Body Dressing : Set up;Sitting ?  ?Lower Body Dressing: Min guard;Sit to/from stand ?  ?Toilet Transfer: Min guard;Ambulation;Comfort height toilet ?Toilet Transfer Details (indicate cue type and reason): no AD ?Toileting- Water quality scientist and Hygiene: Min guard;Sit to/from stand ?  ?  ?  ?  ?General ADL Comments: Advised her to use her built in shower seat at least initially since she is a bit off  balance when up on her feet  ? ? ? ?Vision Baseline Vision/History: 1 Wears glasses ?Ability to See in Adequate Light: 1 Impaired ?Patient Visual Report: No change from baseline ?Additional Comments: legally blind in left eye (can see outlines/movement but no details); has trouble with saccades (additiona eye shifts). reports she still drives  ?   ?   ?   ? ?Pertinent Vitals/Pain Pain Assessment ?Pain Assessment: No/denies pain  ? ? ? ?Hand Dominance Right ?   ?Extremity/Trunk Assessment Upper Extremity Assessment ?Upper Extremity Assessment: Overall WFL for tasks assessed ?  ?Lower Extremity Assessment ?Lower Extremity Assessment: RLE deficits/detail ?RLE Deficits / Details: AROM WFL, strength grossly 4/5 throughout except ankle DF 3+/5 ?  ?  ?  ?Communication Communication ?Communication: No difficulties ?  ?Cognition Arousal/Alertness: Awake/alert ?Behavior During Therapy: [facility] for tasks assessed/performed ?Overall Cognitive Status: Within Functional Limits for tasks assessed ?  ?  ?  ?  ?  ?  ?  ?  ?  ?  ?  ?  ?  ?  ?  ?  ?  ?  ?  ?   ?   ?   ? ? ?Home Living Family/patient expects to be discharged to:: Private residence ?Living Arrangements: Spouse/significant other ?Available Help at Discharge: Family;Available 24 hours/day ?Type of Home: House ?Home Access: Level entry ?  ?  ?Home Layout: One level ?  ?  ?Bathroom Shower/Tub: Walk-in shower ?  ?Bathroom Toilet:  (comfort) ?  ?  ?Home Equipment: None;Shower seat - built in ?  ?  ?  ? ?  ?Prior Functioning/Environment Prior Level of Function : Independent/Modified Independent ?  ?  ?  ?  ?  ?  ?  ?  ?  ? ?  ?  ?OT Problem List: Impaired vision/perception;Impaired balance (sitting and/or standing) ?  ?   ?OT Treatment/Interventions: Self-care/ADL training;Balance training;Patient/family education  ?  ?OT Goals(Current goals can be found in the care plan section) Acute Rehab OT Goals ?Patient Stated Goal: hopeful to go home tomorrow ?OT Goal Formulation: With patient ?Time For Goal Achievement: 04/24/21 ?Potential to Achieve Goals: Good  ?OT Frequency: Min 2X/week ?  ? ?   ?AM-PAC OT "6 Clicks" Daily Activity     ?Outcome Measure Help from another person eating meals?: None ?Help from another person taking care of personal grooming?: A Little ?Help from another person toileting, which includes using toliet, bedpan, or urinal?: A Little ?Help from another person bathing (including washing, rinsing, drying)?: A  Little ?Help from another person to put on and taking off regular upper body clothing?: A Little ?Help from another person to put on and taking off regular lower body clothing?: A Little ?6 Click Score: 19 ?  ?End of Session Equipment Utilized During Treatment: Gait belt ?Nurse Communication: Mobility status ? ?Activity Tolerance: Patient tolerated treatment well ?Patient left: in chair;with call bell/phone within reach;with chair alarm set ? ?OT Visit Diagnosis: Unsteadiness on feet (R26.81) (low vision left eye)  ?              ?Time: 1287-8676 ?OT Time Calculation (min): 24 min ?Charges:  OT General Charges ?$OT Visit: 1 Visit ?OT Evaluation ?$OT Eval Moderate Complexity: 1 Mod ? ?[place]en Circle, OTR/L ?Acute Rehab Services ?Pager [phone] ?Office [phone] ? ? ? ?Almon Register ?04/10/2021, 10:52 AM ?

## 2021-04-10 NOTE — Progress Notes (Signed)
?  Transition of Care (TOC) Screening Note ? ? ?Patient Details  ?Name: [name] ?Date of Birth: 08-13-1945 ? ? ?Transition of Care (TOC) CM/SW Contact:    ?Benard Halsted, LCSW ?Phone Number: ?04/10/2021, 8:28 AM ? ? ? ?Transition of Care Department [facility]) has reviewed patient and no TOC needs have been identified at this time. We will continue to monitor patient advancement through interdisciplinary progression rounds. If new patient transition needs arise, please place a TOC consult. ? ? ?

## 2021-04-10 NOTE — Progress Notes (Signed)
SLP Cancellation Note ? ?Patient Details ?Name: Angela Jenkins ?MRN: [id number] ?DOB: May 29, 1945 ? ? ?Cancelled treatment:       Reason Eval/Treat Not Completed: Patient not medically ready ? ? ?Karan Ramnauth, Katherene Ponto ?04/10/2021, 7:49 AM ?

## 2021-04-10 NOTE — TOC CAGE-AID Note (Signed)
Transition of Care (TOC) - CAGE-AID Screening ? ? ?Patient Details  ?Name: Angela Jenkins ?MRN: [id number] ?Date of Birth: January 11, 1946 ? ?Transition of Care (TOC) CM/SW Contact:    ?Alixandria Friedt C Tarpley-Carter, LCSWA ?Phone Number: ?04/10/2021, 12:50 PM ? ? ?Clinical Narrative: ?Pt participated in [place].  Pt stated she does not use substance or ETOH.  Pt was not offered resources, due to no usage of substance or ETOH.    ? ?Passenger transport manager, MSW, LCSW-A ?Pronouns:  She/Her/Hers ?[facility] ?Clinical Social Worker ?Direct Number:  [phone] ?[email or web address] ? ?CAGE-AID Screening: ?  ? ?Have You Ever Felt You Ought to Cut Down on Your Drinking or Drug Use?: No ?Have People Annoyed You By Critizing Your Drinking Or Drug Use?: No ?Have You Felt Bad Or Guilty About Your Drinking Or Drug Use?: No ?Have You Ever Had a Drink or Used Drugs First Thing In The Morning to Steady Your Nerves or to Get Rid of a Hangover?: No ?CAGE-AID Score: 0 ? ?Substance Abuse Education Offered: No ? ?  ? ? ? ? ? ? ?

## 2021-04-11 ENCOUNTER — Inpatient Hospital Stay (HOSPITAL_COMMUNITY): Payer: Medicare HMO

## 2021-04-11 ENCOUNTER — Encounter (HOSPITAL_COMMUNITY): Payer: Medicare HMO

## 2021-04-11 DIAGNOSIS — E7800 Pure hypercholesterolemia, unspecified: Secondary | ICD-10-CM

## 2021-04-11 DIAGNOSIS — I6602 Occlusion and stenosis of left middle cerebral artery: Secondary | ICD-10-CM | POA: Diagnosis not present

## 2021-04-11 DIAGNOSIS — I63412 Cerebral infarction due to embolism of left middle cerebral artery: Secondary | ICD-10-CM | POA: Diagnosis not present

## 2021-04-11 HISTORY — PX: IR CT HEAD LTD: IMG2386

## 2021-04-11 LAB — BASIC METABOLIC PANEL
Anion gap: 7 (ref 5–15)
BUN: 11 mg/dL (ref 8–23)
CO2: 22 mmol/L (ref 22–32)
Calcium: 8 mg/dL — ABNORMAL LOW (ref 8.9–10.3)
Chloride: 107 mmol/L (ref 98–111)
Creatinine, Ser: 0.71 mg/dL (ref 0.44–1.00)
GFR, Estimated: >60 mL/min (ref >60)
Glucose, Bld: 113 mg/dL — ABNORMAL HIGH (ref 70–99)
Potassium: 3.8 mmol/L (ref 3.5–5.1)
Sodium: 136 mmol/L (ref 135–145)

## 2021-04-11 LAB — CBC
HCT: 31.1 % — ABNORMAL LOW (ref 36.0–46.0)
Hemoglobin: 10.6 g/dL — ABNORMAL LOW (ref 12.0–15.0)
MCH: 34.4 pg — ABNORMAL HIGH (ref 26.0–34.0)
MCHC: 34.1 g/dL (ref 30.0–36.0)
MCV: 101 fL — ABNORMAL HIGH (ref 80.0–100.0)
Platelets: 257 10*3/uL (ref 150–400)
RBC: 3.08 MIL/uL — ABNORMAL LOW (ref 3.87–5.11)
RDW: 12.9 % (ref 11.5–15.5)
WBC: 10.1 10*3/uL (ref 4.0–10.5)
nRBC: 0 % (ref 0.0–0.2)

## 2021-04-11 MED ORDER — ATORVASTATIN CALCIUM 40 MG PO TABS
40.0000 mg | ORAL_TABLET | Freq: Every day | ORAL | 3 refills | Status: AC
Start: 2021-04-12 — End: ?

## 2021-04-11 MED ORDER — POTASSIUM CHLORIDE CRYS ER 20 MEQ PO TBCR
20.0000 meq | EXTENDED_RELEASE_TABLET | Freq: Once | ORAL | Status: AC
  Administered 2021-04-11: 20 meq via ORAL
  Filled 2021-04-11: qty 1

## 2021-04-11 MED ORDER — CLOPIDOGREL BISULFATE 75 MG PO TABS: 75.0000 mg | ORAL_TABLET | Freq: Every day | ORAL | 1 refills | Status: AC

## 2021-04-11 MED ORDER — ASPIRIN 81 MG PO CHEW: 81.0000 mg | CHEWABLE_TABLET | Freq: Every day | ORAL | 3 refills | Status: AC

## 2021-04-11 MED ORDER — ATORVASTATIN CALCIUM 40 MG PO TABS
40.0000 mg | ORAL_TABLET | Freq: Every day | ORAL | Status: DC
Start: 2021-04-11 — End: 2021-04-11
  Administered 2021-04-11: 40 mg via ORAL

## 2021-04-11 NOTE — Progress Notes (Signed)
Physical Therapy Treatment ?Patient Details ?Name: Angela Jenkins ?MRN: [id number] ?DOB: 1945-09-09 ?Today's Date: 04/11/2021 ? ? ?History of Present Illness 76 y.o. female presented to ED for episode of aphasia, right facial droop and right-sided weakness.  Symptoms not improved on EMS arrival and nearly resolved in ER.  Slight clumsiness right hand and right leg.  CT no acute finding. CT head and neck done showed left ICA occlusion and likely acute left M2 occlusion.MRI showed punctate left parietal infarct. Several hours later patient's symptoms getting worse with right-sided weakness and aphasia.  Repeat CTA showed left ICA partially recannulized with left M1 occlusion. Mechanical thrombectomy performed with TICI3 left M1 and left ICA angioplasty with 30% residual stenosis.  MRI: left BG and caudate head infarct with scattered left MCA punctate infarcts, and mild petechial hemorrhage and SAH. 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 ?  ?PT Comments  ? ? Patient reports not planning to use the walker at home despite concern for R LE weakness and decreased sensation and states she had balance issues prior to this episode.  She has supportive family so still recommending outpatient PT follow up with family support.  Educated on fall prevention with spouse in the room.  PT will follow up if not d/c.   ?Recommendations for follow up therapy are one component of a multi-disciplinary discharge planning process, led by the attending physician.  Recommendations may be updated based on patient status, additional functional criteria and insurance authorization. ? ?Follow Up Recommendations ? Outpatient PT ?  ?  ?Assistance Recommended at Discharge Intermittent Supervision/Assistance  ?Patient can return home with the following A little help with walking and/or transfers;A little help with bathing/dressing/bathroom;Assistance with cooking/housework;Assist for transportation;Help with  stairs or ramp for entrance ?  ?Equipment Recommendations ? Rolling walker (2 wheels)  ?  ?Recommendations for Other Services   ? ? ?  ?Precautions / Restrictions Precautions ?Precautions: Fall ?Precaution Comments: legally blind in L eye ?Restrictions ?Weight Bearing Restrictions: No  ?  ? ?Mobility ? Bed Mobility ?  ?  ?  ?  ?  ?  ?  ?General bed mobility comments: up in chair ?  ? ?Transfers ?Overall transfer level: Needs assistance ?Equipment used: None ?Transfers: Sit to/from Stand ?Sit to Stand: Min guard ?  ?  ?  ?  ?  ?General transfer comment: from recliner to stand assist for safety ?  ? ?Ambulation/Gait ?Ambulation/Gait assistance: Min guard, Supervision ?Gait Distance (Feet): 250 Feet ?Assistive device: Rolling walker (2 wheels) ?Gait Pattern/deviations: Step-to pattern, Step-through pattern, Decreased stride length, Trunk flexed ?  ?  ?  ?General Gait Details: mild R LE decreased clearance, veering L with RW and occasional cues/assist to keep walker straight ? ? ?Stairs ?  ?  ?  ?  ?  ? ? ?Wheelchair Mobility ?  ? ?Modified Rankin (Stroke Patients Only) ?Modified Rankin (Stroke Patients Only) ?Pre-Morbid Rankin Score: No symptoms ?Modified Rankin: Moderately severe disability ? ? ?  ?Balance Overall balance assessment: Needs assistance ?Sitting-balance support: Feet supported ?Sitting balance-Leahy Scale: Good ?  ?  ?Standing balance support: No upper extremity supported ?Standing balance-Leahy Scale: Fair ?Standing balance comment: no LOB in room ambulating without walker, but minguard given for safety and pt more hesitant with shorter stride length ?  ?  ?  ?  ?  ?  ?  ?  ?  ?  ?  ?  ? ?  ?Cognition  Arousal/Alertness: Awake/alert ?Behavior During Therapy: [facility] for tasks assessed/performed ?Overall Cognitive Status: Within Functional Limits for tasks assessed ?  ?  ?  ?  ?  ?  ?  ?  ?  ?  ?  ?  ?  ?  ?  ?  ?  ?  ?  ? ?  ?Exercises   ? ?  ?General Comments General comments (skin integrity, edema, etc.):  Educated pt and spouse in fall prevention tips including lighting, footwear, non-slip shower surfaces, grabbars, keeping items frequently used within easy reach and using walker, though pt reports she will not use the walker, discussed having it and not needing it is better than needing it and not having it.  Spouse in agreement,  Discussed outpatient PT to progress R LE coordination/strength and balance, they live closer to [facility]. ?  ?  ? ?Pertinent Vitals/Pain Pain Assessment ?Pain Assessment: No/denies pain  ? ? ?Home Living   ?  ?  ?  ?  ?  ?  ?  ?  ?  ?   ?  ?Prior Function    ?  ?  ?   ? ?PT Goals (current goals can now be found in the care plan section) Progress towards PT goals: Progressing toward goals ? ?  ?Frequency ? ? ? Min 4X/week ? ? ? ?  ?PT Plan Current plan remains appropriate  ? ? ?Co-evaluation   ?  ?  ?  ?  ? ?  ?AM-PAC PT "6 Clicks" Mobility   ?Outcome Measure ? Help needed turning from your back to your side while in a flat bed without using bedrails?: None ?Help needed moving from lying on your back to sitting on the side of a flat bed without using bedrails?: None ?Help needed moving to and from a bed to a chair (including a wheelchair)?: A Little ?Help needed standing up from a chair using your arms (e.g., wheelchair or bedside chair)?: A Little ?Help needed to walk in hospital room?: A Little ?Help needed climbing 3-5 steps with a railing? : Total ?6 Click Score: 18 ? ?  ?End of Session Equipment Utilized During Treatment: Gait belt ?Activity Tolerance: Patient tolerated treatment well ?Patient left: in chair;with call bell/phone within reach;with family/visitor present ?  ?PT Visit Diagnosis: Other abnormalities of gait and mobility (R26.89);Muscle weakness (generalized) (M62.81) ?  ? ? ?Time: 7829-5621 ?PT Time Calculation (min) (ACUTE ONLY): 27 min ? ?Charges:  $Gait Training: 8-22 mins ?$Self Care/Home Management: 8-22          ?          ? ?Magda Kiel, PT ?Acute Rehabilitation  Services ?[phone] ?[phone] ?04/11/2021 ? ? ? ?Reginia Naas ?04/11/2021, 11:28 AM ? ?

## 2021-04-11 NOTE — Progress Notes (Addendum)
PT recommending OP therapy at discharge.  Pt/family prefers therapy near [facility], if possible. Referral to [facility] in [place] for follow up.  ? ?Addendum: 6:24pm ?OP OT referral added as per OT's recommendation. ? ?Reinaldo Raddle, RN, BSN  ?Trauma/Neuro ICU Case Manager ?[phone]  ? ? ?

## 2021-04-11 NOTE — Progress Notes (Signed)
Referring Physician(s): Dr. Lavera Guise  Supervising Physician: Luanne Bras  Patient Status:  Angela Jenkins  Chief Complaint:  Code Stroke s/p LT MCA contact aspiration achieving a TICI 3 revascularization on 3.4.23 by Dr. Dr. Luanne Bras   Subjective:  Patient sitting up in bed, NAD.  Denies HA, has same sensation in both arms.  Ambulated fine today.  States that she is waiting for carotid ultrasound and and may get to go home today.   Allergies: Brimonidine  Medications: Prior to Admission medications   Medication Sig Start Date End Date Taking? Authorizing Provider  amLODipine (NORVASC) 5 MG tablet Take 5 mg by mouth daily. 02/01/21  Yes [provider]  denosumab (PROLIA) 60 MG/ML SOSY injection Inject 60 mg into the skin every 6 (six) months.   Yes [provider]  dorzolamide-timolol (COSOPT) 22.3-6.8 MG/ML ophthalmic solution Place 1 drop into both eyes 2 (two) times daily.   Yes [provider]  hydrochlorothiazide (HYDRODIURIL) 12.5 MG tablet Take 12.5 mg by mouth daily. 02/01/21  Yes [provider]  latanoprost (XALATAN) 0.005 % ophthalmic solution Place 1 drop into both eyes at bedtime.   Yes [provider]  losartan (COZAAR) 100 MG tablet Take 100 mg by mouth daily. 03/30/21  Yes [provider]  VITAMIN D PO Take 20,000 Units by mouth once a week. Wednesdays   Yes [provider]     Vital Signs: BP (!) 110/49    Pulse 73    Temp 99.2 F (37.3 C) (Oral)    Resp 19    Ht '5\' 2"'$  (1.575 m)    Wt 123 lb 0.3 oz (55.8 kg)    SpO2 99%    BMI 22.50 kg/m   Physical Exam Vitals and nursing note reviewed.  Constitutional:      General: She is not in acute distress.    Appearance: She is well-developed. She is not ill-appearing.  HENT:     Head: Normocephalic and atraumatic.  Eyes:     Conjunctiva/sclera: Conjunctivae normal.  Pulmonary:     Effort: Pulmonary effort is normal.   Musculoskeletal:        General: Normal range of motion.     Cervical back: Normal range of motion.  Skin:    General: Skin is warm.  Neurological:     Mental Status: She is alert and oriented to person, place, and time.     Comments: Alert, aware and oriented X 3 Speech and comprehension is intact.  No facial droop noted Tongue midline Can spontaneously move all 4 extremities.  Gait not assessed Romberg not assessed Heel to toe not assessed Distal pulses not assessed   Psychiatric:        Mood and Affect: Mood normal.        Behavior: Behavior normal.    Imaging: MR BRAIN WO CONTRAST  Addendum Date: 04/09/2021   ADDENDUM REPORT: 04/09/2021 11:36 ADDENDUM: These results were called by telephone at the time of interpretation on 04/09/2021 at 11:36 am to provider Dr. Erlinda Hong, who verbally acknowledged these results. Electronically Signed   By: Kellie Simmering D.O.   On: 04/09/2021 11:36   Result Date: 04/09/2021 CLINICAL DATA:  Provided history: Neuro deficit, acute, stroke suspected. EXAM: MRI HEAD WITHOUT CONTRAST TECHNIQUE: Multiplanar, multiecho pulse sequences of the brain and surrounding structures were obtained without intravenous contrast. COMPARISON:  Prior CTA head/neck examinations 04/08/2021, prior noncontrast head CT examinations 04/08/2021, prior brain MRI 04/08/2021. Postprocedural head  CT performed yesterday. FINDINGS: Brain: Mild-to-moderate generalized cerebral and cerebellar atrophy. New from the prior brain MRI of 04/08/2021, there are acute infarcts involving much of the left caudate and lentiform nuclei, and likely small portions of the left internal capsule. Slight regional mass effect without significant ventricular effacement or midline shift. Mild petechial hemorrhage within the left basal ganglia. There are also new small cortically based infarcts within the left frontal and parietal lobes (left MCA vascular territory). There are a few punctate foci of petechial hemorrhage  within the high left parietal lobe, also new from the prior MRI. There is T2 FLAIR hyperintense signal abnormality and susceptibility-weighted signal loss along portions of the left frontal, parietal and temporal lobes, compatible with small-volume acute subarachnoid hemorrhage. Redemonstrated small chronic cortical infarct within the lateral left frontoparietal lobes. Mild multifocal T2 FLAIR hyperintense signal abnormality within the cerebral white matter, nonspecific but compatible with chronic small vessel ischemic disease. No evidence of an intracranial mass. No evidence of hydrocephalus. Vascular: Maintained flow voids within the proximal large arterial vessels. Skull and upper cervical spine: No focal suspicious marrow lesion. Sinuses/Orbits: Visualized orbits show no acute finding. Prior right ocular lens replacement. Mild mucosal thickening within the bilateral ethmoid, right sphenoid and bilateral maxillary sinuses. Small mucous retention cyst within the left sphenoid sinus. IMPRESSION: New from the prior brain MRI of 04/08/2021, there are acute infarcts involving much of the left caudate and lentiform nuclei, as well as small portions of the left internal capsule. Slight regional mass effect without ventricular effacement or midline shift. Mild petechial hemorrhage within the left basal ganglia. There are several small acute cortically-based infarcts within the left frontal and parietal lobes, which are also new from the prior MRI (left MCA vascular territory). Additionally, there are a few new punctate foci of petechial hemorrhage within the high left parietal lobe. Signal abnormality along portions of the left frontal, parietal and temporal lobes, compatible with small-volume acute subarachnoid hemorrhage. Chronic findings without interval change, as described. Paranasal sinus disease, as outlined. Electronically Signed: By: Kellie Simmering D.O. On: 04/09/2021 11:31   MR BRAIN WO CONTRAST  Result Date:  04/08/2021 CLINICAL DATA:  Provided history: Neuro deficit, acute, stroke suspected. EXAM: MRI HEAD WITHOUT CONTRAST TECHNIQUE: Multiplanar, multiecho pulse sequences of the brain and surrounding structures were obtained without intravenous contrast. COMPARISON:  CT angiogram head/neck and non-contrast head CT performed earlier today 04/08/2021. FINDINGS: Brain: Mild-to-moderate generalized cerebral and cerebellar atrophy. Punctate acute or cortical infarct within the left parietal lobe (series 2, image 41 (left MCA vascular territory). Small chronic cortical infarct within the lateral left frontoparietal lobes (series 6, image 26). Mild multifocal T2 FLAIR hyperintense signal abnormality within the cerebral white matter, nonspecific but compatible with chronic small vessel ischemic disease. No evidence of an intracranial mass. No chronic intracranial blood products. No extra-axial fluid collection. No midline shift. Vascular: Signal abnormality within the petrous and proximal cavernous left ICA at site of known vessel occlusion. Focus of susceptibility weighted signal loss within the left sylvian fissure, likely reflecting thrombus within an occluded proximal M2 left MCA vessel (series 7, image 53). Skull and upper cervical spine: No focal suspicious marrow lesion. Sinuses/Orbits: Visualized orbits show no acute finding. Prior left ocular lens replacement. Mild mucosal thickening within the bilateral maxillary sinuses. Trace mucosal thickening within the bilateral ethmoid sinuses. IMPRESSION: Punctate acute cortical infarct within the left parietal lobe (left MCA vascular territory). Small chronic cortically-based infarct within the lateral left frontoparietal lobes (left MCA vascular territory).  Mild chronic small vessel ischemic changes within the cerebral white matter. Signal abnormality within the petrous and proximal cavernous segments of the left ICA, at site of known vessel occlusion. Focus of  susceptibility-weighted signal loss within the left sylvian fissure, likely reflecting thrombus within an occluded proximal M2 left MCA vessel (as was demonstrated on the CTA head performed earlier today). Mild-to-moderate generalized cerebral and cerebellar atrophy. Paranasal sinus disease, as described. Electronically Signed   By: Kellie Simmering D.O.   On: 04/08/2021 16:19   Portable Chest x-ray  Result Date: 04/08/2021 CLINICAL DATA:  Endotracheal tube placement. EXAM: PORTABLE CHEST 1 VIEW COMPARISON:  07/15/2011. FINDINGS: The heart size and mediastinal contours are within normal limits. No consolidation, effusion, or pneumothorax. An endotracheal tube terminates 3.1 cm above the carina. An enteric tube terminates in the stomach. Multiple old rib fractures are noted on the right with bone callus formation. IMPRESSION: 1. No active disease. 2. Endotracheal and enteric tubes as described above. Electronically Signed   By: Brett Fairy M.D.   On: 04/08/2021 23:07   ECHOCARDIOGRAM COMPLETE  Result Date: 04/08/2021    ECHOCARDIOGRAM REPORT   Patient Name:   Angela Jenkins Date of Exam: 04/08/2021 Medical Rec #:  [id number]   Height:       63.0 in Accession #:    [id number]  Weight:       121.3 lb Date of Birth:  01-23-1946   BSA:          1.563 m Patient Age:    76 years    BP:           134/72 mmHg Patient Gender: F           HR:           87 bpm. Exam Location:  Inpatient Procedure: 2D Echo, Cardiac Doppler and Color Doppler Indications:    Stroke  History:        Patient has no prior history of Echocardiogram examinations.  Sonographer:    Merrie Roof RDCS Referring Phys: [id number] [place]  1. Left ventricular ejection fraction, by estimation, is 60 to 65%. The left ventricle has normal function. The left ventricle has no regional wall motion abnormalities. Left ventricular diastolic parameters were normal.  2. Right ventricular systolic function is normal. The right ventricular size is normal.  There is normal pulmonary artery systolic pressure.  3. The mitral valve is normal in structure. No evidence of mitral valve regurgitation. No evidence of mitral stenosis.  4. The aortic valve is normal in structure. Aortic valve regurgitation is not visualized. No aortic stenosis is present. FINDINGS  Left Ventricle: Left ventricular ejection fraction, by estimation, is 60 to 65%. The left ventricle has normal function. The left ventricle has no regional wall motion abnormalities. The left ventricular internal cavity size was normal in size. There is  no left ventricular hypertrophy. Left ventricular diastolic parameters were normal. Right Ventricle: The right ventricular size is normal. Right vetricular wall thickness was not well visualized. Right ventricular systolic function is normal. There is normal pulmonary artery systolic pressure. The tricuspid regurgitant velocity is 2.17 m/s, and with an assumed right atrial pressure of 3 mmHg, the estimated right ventricular systolic pressure is 66.5 mmHg. Left Atrium: Left atrial size was normal in size. Right Atrium: Right atrial size was normal in size. Pericardium: There is no evidence of pericardial effusion. Mitral Valve: The mitral valve is normal in structure. No evidence of mitral valve regurgitation. No evidence  of mitral valve stenosis. Tricuspid Valve: The tricuspid valve is normal in structure. Tricuspid valve regurgitation is trivial. Aortic Valve: The aortic valve is normal in structure. Aortic valve regurgitation is not visualized. No aortic stenosis is present. Aortic valve mean gradient measures 3.0 mmHg. Aortic valve peak gradient measures 6.2 mmHg. Aortic valve area, by VTI measures 1.78 cm. Pulmonic Valve: The pulmonic valve was not well visualized. Pulmonic valve regurgitation is not visualized. Aorta: The aortic root and ascending aorta are structurally normal, with no evidence of dilitation. IAS/Shunts: The atrial septum is grossly normal.   LEFT VENTRICLE PLAX 2D LVIDd:         4.00 cm   Diastology LVIDs:         2.70 cm   LV e' medial:    5.66 cm/s LV PW:         0.90 cm   LV E/e' medial:  11.4 LV IVS:        1.00 cm   LV e' lateral:   7.07 cm/s LVOT diam:     1.70 cm   LV E/e' lateral: 9.1 LV SV:         45 LV SV Index:   29 LVOT Area:     2.27 cm  RIGHT VENTRICLE RV Basal diam:  3.10 cm LEFT ATRIUM             Index        RIGHT ATRIUM          Index LA diam:        2.70 cm 1.73 cm/m   RA Area:     9.96 cm LA Vol (A2C):   40.1 ml 25.65 ml/m  RA Volume:   18.70 ml 11.96 ml/m LA Vol (A4C):   34.5 ml 22.07 ml/m LA Biplane Vol: 37.7 ml 24.12 ml/m  AORTIC VALVE AV Area (Vmax):    1.76 cm AV Area (Vmean):   1.73 cm AV Area (VTI):     1.78 cm AV Vmax:           124.00 cm/s AV Vmean:          87.200 cm/s AV VTI:            0.254 m AV Peak Grad:      6.2 mmHg AV Mean Grad:      3.0 mmHg LVOT Vmax:         96.00 cm/s LVOT Vmean:        66.500 cm/s LVOT VTI:          0.199 m LVOT/AV VTI ratio: 0.78  AORTA Ao Root diam: 2.80 cm MITRAL VALVE                TRICUSPID VALVE MV Area (PHT): 3.43 cm     TR Peak grad:   18.8 mmHg MV Decel Time: 221 msec     TR Vmax:        217.00 cm/s MV E velocity: 64.30 cm/s MV A velocity: 104.00 cm/s  SHUNTS MV E/A ratio:  0.62         Systemic VTI:  0.20 m                             Systemic Diam: 1.70 cm Mertie Moores MD Electronically signed by Mertie Moores MD Signature Date/Time: 04/08/2021/5:24:55 PM    Final    IR PERCUTANEOUS ART THROMBECTOMY/INFUSION INTRACRANIAL INC DIAG ANGIO  Result Date:  04/11/2021 CLINICAL DATA:  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 EXAM: IR PERCUTANEOUS ART THORMBECTOMY/INFUSION INTRACRANIAL INCLUDE DIAG ANGIO COMPARISON:  CT angiogram of the head and neck of April 08, 2021. MEDICATIONS:  Heparin none units IV. Ancef 2 g IV antibiotic was administered within 1 hour of the procedure. ANESTHESIA/SEDATION: General anesthesia. CONTRAST:  Omnipaque 300 approximately 100 mL. FLUOROSCOPY TIME:  Fluoroscopy Time: 37 minutes 0 seconds (1495 mGy). COMPLICATIONS: None immediate. TECHNIQUE: Informed written consent was obtained from the patient's spouse after a thorough discussion of the procedural risks, benefits and alternatives. All questions were addressed. Maximal Sterile Barrier Technique was utilized including caps, mask, sterile gowns, sterile gloves, sterile drape, hand hygiene and skin antiseptic. A timeout was performed prior to the initiation of the procedure. The right groin was prepped and draped in the usual sterile fashion. Thereafter using modified Seldinger technique, transfemoral access into the right common femoral artery was obtained without difficulty. Over a 0.035 inch guidewire, an 8 [place] Pinnacle 25 cm sheath was inserted. Through this, and also over 0.035 inch guidewire, a 5 [place] JB 1 catheter was advanced to the aortic arch region and selectively positioned in the left common carotid artery and the right common carotid artery. FINDINGS: Left common carotid arteriogram demonstrates the left external carotid artery and its major branches to be widely patent. The left internal carotid artery at the bulb has a severe high-grade pre occlusive stenosis with a smooth filling defect at the bulb. Slow ascent of contrast is seen in the left internal carotid artery to the cranial skull base decompressed vessel. 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 ENDOVASCULAR REVASCULARIZATION OF LEFT INTERNAL CAROTID ARTERY PROXIMALLY, AND LEFT MIDDLE CEREBRAL ARTERY Diagnostic JB 1 catheter in the left common carotid artery was then exchanged for a 0.035 inch 300 cm Rosen exchange guidewire for a 95 cm 087 balloon guide catheter which had been prepped with 50%  contrast and 50% heparinized saline infusion. The guidewire was removed. Brisk aspiration was achieved at the hub of the balloon guide catheter. A gentle control arteriogram performed through the balloon guide catheter demonstrated no evidence of spasms, dissections or of intraluminal filling defects. Over a 0.014 inch standard Synchro micro guidewire with a moderate J configuration, an 021 162 cm microcatheter was then advanced to the distal end of the balloon guide catheter in the distal left common carotid artery. Using a torque device and constant roadmap technique, micro guidewire was then gently advanced without any difficulty to the petrous cavernous junction of the left internal carotid artery followed by the microcatheter. The guidewire was removed. Good aspiration obtained from the hub of the microcatheter. Gentle control arteriogram performed through the microcatheter continued to demonstrate a left M1 proximal occlusion. Microcatheter was then replaced by a 300 cm Synchro standard 014 inch exchange micro guidewire with a mild J configuration using biplane roadmap technique and constant fluoroscopic guidance. A 4 mm x 20 mm Viatrac 14 balloon angioplasty catheter which had been prepped retrogradely with heparinized saline infusion and intracranially with 50% contrast and 50% heparinized saline infusion was advanced to the left internal carotid artery proximally and its marker was placed adequate distant from the site of the severe stenosis. Control inflation was then performed using a micro inflation syringe device via micro tubing to  8 atmospheres for approximately 30 seconds. Prior to the angioplasty, proximal flow arrest was initiated by inflating the balloon of the balloon guide catheter in the distal left common carotid artery. Also during this time constant aspiration was applied at the hub of the balloon guide catheter with a Penumbra pump. Aspiration was continued as the balloon was deflated in  the left common carotid artery. The catheter was retrieved and removed. Control arteriogram performed through the balloon guide catheter now demonstrates significantly improved caliber and flow through angioplastied segment. Approximately 70% patency was evident angiographically at the proximal left internal carotid artery. Over the exchange micro guidewire, an 021 162 cm microcatheter was then advanced inside of a 132 cm 55 cm Zoom aspiration catheter to the supraclinoid left ICA. The exchange micro guidewire was removed. Good aspiration obtained from the hub of the microcatheter. Over an 014 inch standard Synchro micro with a moderate J configuration, the microcatheter was advanced into the distal M2 M3 region of the inferior division followed by the microcatheter. The guidewire was removed. Good aspiration obtained from the hub of the microcatheter a gentle control arteriogram performed through the microcatheter demonstrated safe positioning of the tip of the microcatheter. A 4 mm x 40 mm Solitaire X retrieval device was then advanced to the distal end of the microcatheter. This was then deployed in the usual manner with proximal portion of the retrieval device just proximal to the occluded left MCA. The 27 Zoom aspiration catheter was then advanced to engage the proximal portion of the left middle cerebral artery occlusion. Thereafter with proximal flow arrest in the left internal carotid artery with constant aspiration applied with a 20 mL syringe at the hub of the balloon guide catheter and the Zoom aspiration catheter with a Penumbra pump for approximately 2-1/2 minutes, the combination of the retrieval device, the microcatheter and the Zoom aspiration catheter was retrieved and removed. Following reversal of flow arrest in the left internal carotid artery a control arteriogram performed through the balloon guide catheter now demonstrated complete revascularization of the left middle cerebral artery  distribution achieving a TICI 3 revascularization. The left anterior cerebral artery demonstrated complete opacification into the capillary and venous phases with transient cross-filling via the anterior communicating artery of the right anterior cerebral artery A2 segment and distally. Moderate spasm in the mid left internal carotid artery responded to 2 aliquots of 25 mcg nitroglycerin. Angioplasty segment of the left internal carotid proximally continued to be patent to approximately 70% with no evidence of irregular filling defects except for the plaque anteriorly which was stable for 60 minutes. It was decided not to place a stent at the angioplasty for now. The balloon guide was removed. The 8 [place] Pinnacle sheath was replaced with an 8 [place] Angio-Seal closure device. Distal pulses remained Dopplerable in both feet at the end of the procedure. An immediate CT scan of the brain performed demonstrated contrast stain in the left putamen, extending slightly medially. Also noted was mild hyperattenuation in the subarachnoid space overlying the left parietal region. No evidence of mass or midline shift noted. Patient left intubated secondary to her preprocedural neurological deficits, and also to protect her airway. She was then transferred to the neuro ICU for post thrombectomy management. IMPRESSION: Status post endovascular complete revascularization of occluded left middle cerebral artery M1 segment with 1 pass with a 4 mm x 40 mm Solitaire X retrieval device, and contact aspiration achieving a TICI 3 aspiration. Status post endovascular revascularization of symptomatic pre  occlusive stenosis of the proximal left internal carotid artery with balloon angioplasty with an approximately 70% patency post angioplasty. PLAN: Follow-up in clinic 2 weeks post discharge. Ultrasound of the carotids prior to patient's discharge from the hospital to evaluate left internal carotid artery proximally. Continue with aspirin  81 mg a day, and Plavix 75 mg a day. Electronically Signed   By: Luanne Bras M.D.   On: 04/11/2021 09:08   CT HEAD CODE STROKE WO CONTRAST  Result Date: 04/08/2021 CLINICAL DATA:  Code stroke. Provided history: Neuro deficit, acute, stroke suspected. EXAM: CT HEAD WITHOUT CONTRAST TECHNIQUE: Contiguous axial images were obtained from the base of the skull through the vertex without intravenous contrast. RADIATION DOSE REDUCTION: This exam was performed according to the departmental dose-optimization program which includes automated exposure control, adjustment of the mA and/or kV according to patient size and/or use of iterative reconstruction technique. COMPARISON:  Brain MRI 04/08/2021. Non-contrast head CT and CT angiogram head/neck performed earlier today 04/08/2021. FINDINGS: Brain: Mild-to-moderate generalized cerebral atrophy. Comparatively mild cerebellar atrophy. A punctate acute cortical infarct within the left parietal lobe was better appreciated on the brain MRI performed earlier today. A small chronic cortical infarct within the lateral left frontoparietal lobes was also better appreciated on this prior examination. Mild patchy and ill-defined hypoattenuation within the cerebral white matter, nonspecific but compatible with chronic small vessel ischemic disease. There is no acute intracranial hemorrhage. No extra-axial fluid collection. No evidence of an intracranial mass. No midline shift. Vascular: Occlusion of the left ICA within the neck and at the petrous segment was demonstrated on the CTA head/neck performed earlier today. Occlusion of a proximal M2 left MCA vessel was also demonstrated on the head CTA performed earlier today. Atherosclerotic calcifications. Skull: Normal. Negative for fracture or focal lesion. Sinuses/Orbits: Visualized orbits show no acute finding. Mild mucosal thickening within the bilateral maxillary sinuses. ASPECTS [facility] Stroke Program Early CT Score) -  Ganglionic level infarction (caudate, lentiform nuclei, internal capsule, insula, M1-M3 cortex): 7 - Supraganglionic infarction (M4-M6 cortex): 3 Total score (0-10 with 10 being normal): 10 These results were communicated to Dr. Lorrin Goodell At 6:58 pmon 3/4/2023by text page via the [facility] messaging system. IMPRESSION: A punctate acute cortical infarct within the left parietal lobe was better appreciated on the brain MRI performed earlier today. No evidence of acute intracranial hemorrhage. Chronic findings without appreciable interval change from the brain MRI performed earlier today, as described. Mild mucosal thickening within the bilateral maxillary sinuses. Electronically Signed   By: Kellie Simmering D.O.   On: 04/08/2021 18:58   CT HEAD CODE STROKE WO CONTRAST  Result Date: 04/08/2021 CLINICAL DATA:  Code stroke. Neuro deficit, acute, stroke suspected. Right-sided weakness. EXAM: CT HEAD WITHOUT CONTRAST TECHNIQUE: Contiguous axial images were obtained from the base of the skull through the vertex without intravenous contrast. RADIATION DOSE REDUCTION: This exam was performed according to the departmental dose-optimization program which includes automated exposure control, adjustment of the mA and/or kV according to patient size and/or use of iterative reconstruction technique. COMPARISON:  No pertinent prior exams available for comparison. FINDINGS: Brain: Mild-to-moderate generalized cerebral and cerebellar atrophy. Mild ill-defined hypoattenuation within the periventricular white matter, nonspecific but compatible chronic small vessel ischemic disease. There is no acute intracranial hemorrhage. No demarcated cortical infarct. No extra-axial fluid collection. No evidence of an intracranial mass. No midline shift. Vascular: No hyperdense vessel.  Atherosclerotic calcifications. Skull: Normal. Negative for fracture or focal lesion. Sinuses/Orbits: Visualized orbits show no acute finding. No  significant paranasal  sinus disease at the imaged levels. ASPECTS [facility] Stroke Program Early CT Score) - Ganglionic level infarction (caudate, lentiform nuclei, internal capsule, insula, M1-M3 cortex): 7 - Supraganglionic infarction (M4-M6 cortex): 3 Total score (0-10 with 10 being normal): 10 These results were communicated to Dr. Erlinda Hong At 12:33 pmon 3/4/2023by text page via the [facility] messaging system. IMPRESSION: No evidence of acute intracranial abnormality. Mild chronic small vessel given changes within the cerebral white matter. Mild-to-moderate generalized parenchymal atrophy. Electronically Signed   By: Kellie Simmering D.O.   On: 04/08/2021 12:33   CT ANGIO HEAD NECK W WO CM W PERF  Result Date: 04/08/2021 CLINICAL DATA:  Code stroke. EXAM: CT ANGIOGRAPHY HEAD AND NECK CT PERFUSION BRAIN TECHNIQUE: Multidetector CT imaging of the head and neck was performed using the standard protocol during bolus administration of intravenous contrast. Multiplanar CT image reconstructions and MIPs were obtained to evaluate the vascular anatomy. Carotid stenosis measurements (when applicable) are obtained utilizing NASCET criteria, using the distal internal carotid diameter as the denominator. Multiphase CT imaging of the brain was performed following IV bolus contrast injection. Subsequent parametric perfusion maps were calculated using RAPID software. RADIATION DOSE REDUCTION: This exam was performed according to the departmental dose-optimization program which includes automated exposure control, adjustment of the mA and/or kV according to patient size and/or use of iterative reconstruction technique. CONTRAST:  140m OMNIPAQUE IOHEXOL 350 MG/ML SOLN COMPARISON:  Noncontrast head CT examinations, CT angiogram head/neck and brain MRI performed earlier today 04/08/2021. FINDINGS: CTA NECK FINDINGS Aortic arch: Standard aortic branching. Atherosclerotic plaque within the proximal major branch vessels of the neck. Streak and beam hardening artifact  arising from a dense right-sided contrast bolus partially obscures the right subclavian artery. Within this limitation, there is no hemodynamically significant innominate or proximal subclavian artery stenosis. Right carotid system: CCA and ICA patent within the neck without significant stenosis (50% or greater). Mild atherosclerotic plaque about the carotid bifurcation. Left carotid system: The common carotid artery is patent. Atherosclerotic plaque about the carotid bifurcation. There has been interval reconstitution of flow within the cervical left ICA, although this vessel remains diminutive. Additionally, there is eccentric hypodensity within the vessel lumen at the ICA origin (which may reflect soft plaque or thrombus) with resultant severe focal stenosis at this site. Vertebral arteries: The vertebral arteries are codominant and patent within the neck without stenosis. Skeleton: Cervical spondylosis. No acute bony abnormality or aggressive osseous lesion. Grade 1 anterolisthesis at C2-C3, C3-C4 and C4-C5. Other neck: Subcentimeter thyroid nodules, not meeting consensus criteria for ultrasound follow-up based on size. Upper chest: No consolidation within the imaged lung apices. Review of the MIP images confirms the above findings CTA HEAD FINDINGS Anterior circulation: The intracranial left ICA is now patent throughout. However, there is persistent severe narrowing of the petrous and proximal cavernous segments. New from the prior exam, subtle linear thrombus is questioned within the distal cavernous left ICA (for instance as seen on series 7, images 129 and 130). 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 There is some reconstitution of enhancement within M2 and more distal left MCA vessels. The intracranial right ICA is patent. Mild nonstenotic calcified plaque within this vessel. The M1 right middle cerebral artery is patent. No right M2 proximal  branch occlusion or high-grade proximal stenosis is identified. The anterior cerebral arteries are patent. Posterior circulation: The intracranial vertebral arteries are patent. The basilar artery is patent. Short  segment fenestration within the proximal basilar artery. The posterior cerebral arteries are patent. Posterior communicating arteries are diminutive or absent bilaterally. Distal branch atherosclerotic irregularity of the right PCA. Venous sinuses: Within the limitations of contrast timing, no convincing thrombus. Anatomic variants: As described. Review of the MIP images confirms the above findings CT Brain Perfusion Findings: CBF (<30%) Volume: 40m Perfusion (Tmax>6.0s) volume: 842mMismatch Volume: 8029mnfarction Location:Left MCA vascular territory These results were called by telephone at the time of interpretation on 04/08/2021 at 7:03 pm [facility]. IMPRESSION: CTA neck: Since the CTA performed earlier today, there has been interval reconstitution of flow within the cervical left ICA. However, this vessel remains diminutive. Additionally, there is eccentric hypodensity within the vessel lumen at the left ICA origin (which may reflect soft plaque or thrombus) resulting in severe focal stenosis at this site. CTA head: The intracranial left internal carotid artery is now patent. However, there is persistent severe narrowing of the petrous and proximal cavernous segments. New from the prior exam, subtle linear thrombus is questioned within the distal cavernous left ICA. 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 CT perfusion head: The perfusion software identifies core infarcts within the left MCA vascular territory totaling 6 mL. The perfusion software identifies critically hypoperfused parenchyma within the left MCA vascular territory totaling 86 mL (utilizing the Tmax>6 seconds  threshold). Reported mismatch volume: 80 mL. Electronically Signed   By: KylKellie SimmeringO.   On: 04/08/2021 19:23   CT ANGIO HEAD NECK W WO CM (CODE STROKE)  Result Date: 04/08/2021 CLINICAL DATA:  Provided history: Code stroke. Right-sided weakness. EXAM: CT ANGIOGRAPHY HEAD AND NECK TECHNIQUE: Multidetector CT imaging of the head and neck was performed using the standard protocol during bolus administration of intravenous contrast. Multiplanar CT image reconstructions and MIPs were obtained to evaluate the vascular anatomy. Carotid stenosis measurements (when applicable) are obtained utilizing NASCET criteria, using the distal internal carotid diameter as the denominator. RADIATION DOSE REDUCTION: This exam was performed according to the departmental dose-optimization program which includes automated exposure control, adjustment of the mA and/or kV according to patient size and/or use of iterative reconstruction technique. CONTRAST:  57m28mNIPAQUE IOHEXOL 350 MG/ML SOLN COMPARISON:  Noncontrast head CT performed earlier today 04/08/2021. FINDINGS: CTA NECK FINDINGS Aortic arch: Standard aortic branching. Atherosclerotic plaque within the visualized aortic arch and proximal major branch vessels of the neck. Streak and beam hardening artifact arising from a dense right-sided contrast bolus partially obscures the right subclavian artery. Within this limitation, there is no appreciable hemodynamically significant stenosis within the innominate or proximal subclavian arteries. Right carotid system: CCA and ICA patent within the neck without significant stenosis (50% or greater). Mild atherosclerotic plaque about the carotid bifurcation. Left carotid system: The common carotid artery is patent. 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 Atherosclerotic plaque within the distal CCA and about the carotid bifurcation. Vertebral arteries: Vertebral  arteries codominant and patent within the neck without stenosis. Skeleton: Cervical spondylosis. No acute bony abnormality or aggressive osseous lesion. Grade 1 anterolisthesis at C2-C3, C3-C4 and C4-C5. Other neck: Subcentimeter thyroid nodules, not meeting consensus criteria for ultrasound follow-up based on size. Upper chest: No consolidation within the imaged lung apices. Review of the MIP images confirms the above findings CTA HEAD FINDINGS Anterior circulation: The intracranial right ICA is  patent. 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 There is reconstitution of enhancement within the intracranial left ICA beginning at the cavernous segment. The M1 middle cerebral arteries are patent. There is occlusion of a proximal M2 left MCA vessel (series 12, image 28). The anterior cerebral arteries are patent. No intracranial aneurysm is identified. Posterior circulation: The intracranial vertebral arteries are patent. The basilar artery is patent. Short segment fenestration within the proximal basilar artery. The posterior cerebral arteries are patent. Posterior communicating arteries are diminutive or absent bilaterally. Venous sinuses: Within the limitations of contrast timing, no convincing thrombus. Anatomic variants: As described. Review of the MIP images confirms the above findings CTA head impression called by telephone at the time of interpretation on 04/08/2021 at 1:03 pm to provider [facility] , who verbally acknowledged these results. IMPRESSION: CTA neck: 1. The left common carotid artery is patent. 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 2. The right common carotid, right internal carotid and bilateral vertebral arteries are patent within the neck without hemodynamically significant stenosis. 3.  Aortic Atherosclerosis (ICD10-I70.0). CTA head: 1. The intracranial left ICA remains occluded  throughout the petrous segment. There is reconstitution of enhancement within the intracranial left ICA beginning at the cavernous segment. 2. There is occlusion of a proximal M2 left MCA vessel (series 12, image 28). Electronically Signed   By: Kellie Simmering D.O.   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 EXAM: IR PERCUTANEOUS ART THORMBECTOMY/INFUSION INTRACRANIAL INCLUDE DIAG ANGIO COMPARISON:  CT angiogram of the head and neck of April 08, 2021. MEDICATIONS: Heparin none units IV. Ancef 2 g IV antibiotic was administered within 1 hour of the procedure. ANESTHESIA/SEDATION: General anesthesia. CONTRAST:  Omnipaque 300 approximately 100 mL. FLUOROSCOPY TIME:  Fluoroscopy Time: 37 minutes 0 seconds (1495 mGy). COMPLICATIONS: None immediate. TECHNIQUE: Informed written consent was obtained from the patient's spouse after a thorough discussion of the procedural risks, benefits and alternatives. All questions were addressed. Maximal Sterile Barrier Technique was utilized including caps, mask, sterile gowns, sterile gloves, sterile drape, hand hygiene and skin antiseptic. A timeout was performed prior to the initiation of the procedure. The right groin was prepped and draped in the usual sterile fashion. Thereafter using modified Seldinger technique, transfemoral access into the right common femoral artery was obtained without difficulty. Over a 0.035 inch guidewire, an 8 [place] Pinnacle 25 cm sheath was inserted. Through this, and also over 0.035 inch guidewire, a 5 [place] JB 1 catheter was advanced to the aortic arch region and selectively positioned in the left common carotid artery and the  right common carotid artery. FINDINGS: Left common carotid arteriogram demonstrates the left external carotid artery and its major branches to be widely patent. The left internal carotid artery at the bulb has a severe high-grade pre occlusive stenosis with a smooth filling defect at the bulb. Slow ascent of contrast is seen in the left internal carotid artery to the cranial skull base decompressed vessel. 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 ENDOVASCULAR REVASCULARIZATION OF LEFT INTERNAL CAROTID ARTERY PROXIMALLY, AND LEFT MIDDLE CEREBRAL  ARTERY Diagnostic JB 1 catheter in the left common carotid artery was then exchanged for a 0.035 inch 300 cm Rosen exchange guidewire for a 95 cm 087 balloon guide catheter which had been prepped with 50% contrast and 50% heparinized saline infusion. The guidewire was removed. Brisk aspiration was achieved at the hub of the balloon guide catheter. A gentle control arteriogram performed through the balloon guide catheter demonstrated no evidence of spasms, dissections or of intraluminal filling defects. Over a 0.014 inch standard Synchro micro guidewire with a moderate J configuration, an 021 162 cm microcatheter was then advanced to the distal end of the balloon guide catheter in the distal left common carotid artery. Using a torque device and constant roadmap technique, micro guidewire was then gently advanced without any difficulty to the petrous cavernous junction of the left internal carotid artery followed by the microcatheter. The guidewire was removed. Good aspiration obtained from the hub of the microcatheter. Gentle control arteriogram performed through the microcatheter continued to demonstrate a left M1 proximal occlusion. Microcatheter was then replaced by a 300 cm Synchro standard 014 inch exchange micro guidewire with a mild J configuration using biplane roadmap technique and constant fluoroscopic guidance. A 4 mm x 20 mm  Viatrac 14 balloon angioplasty catheter which had been prepped retrogradely with heparinized saline infusion and intracranially with 50% contrast and 50% heparinized saline infusion was advanced to the left internal carotid artery proximally and its marker was placed adequate distant from the site of the severe stenosis. Control inflation was then performed using a micro inflation syringe device via micro tubing to 8 atmospheres for approximately 30 seconds. Prior to the angioplasty, proximal flow arrest was initiated by inflating the balloon of the balloon guide catheter in the distal left common carotid artery. Also during this time constant aspiration was applied at the hub of the balloon guide catheter with a Penumbra pump. Aspiration was continued as the balloon was deflated in the left common carotid artery. The catheter was retrieved and removed. Control arteriogram performed through the balloon guide catheter now demonstrates significantly improved caliber and flow through angioplastied segment. Approximately 70% patency was evident angiographically at the proximal left internal carotid artery. Over the exchange micro guidewire, an 021 162 cm microcatheter was then advanced inside of a 132 cm 55 cm Zoom aspiration catheter to the supraclinoid left ICA. The exchange micro guidewire was removed. Good aspiration obtained from the hub of the microcatheter. Over an 014 inch standard Synchro micro with a moderate J configuration, the microcatheter was advanced into the distal M2 M3 region of the inferior division followed by the microcatheter. The guidewire was removed. Good aspiration obtained from the hub of the microcatheter a gentle control arteriogram performed through the microcatheter demonstrated safe positioning of the tip of the microcatheter. A 4 mm x 40 mm Solitaire X retrieval device was then advanced to the distal end of the microcatheter. This was then deployed in the usual manner with proximal  portion of the retrieval device just proximal to the occluded left MCA. The 51 Zoom aspiration catheter was then advanced to engage the proximal portion of the left middle cerebral artery occlusion. Thereafter with proximal flow arrest in the left internal carotid artery with constant aspiration applied with a 20 mL syringe at the hub of the balloon guide catheter and the Zoom aspiration catheter with a Penumbra pump for approximately 2-1/2 minutes, the combination of the retrieval device, the microcatheter and the Zoom aspiration catheter was retrieved and removed.  Following reversal of flow arrest in the left internal carotid artery a control arteriogram performed through the balloon guide catheter now demonstrated complete revascularization of the left middle cerebral artery distribution achieving a TICI 3 revascularization. The left anterior cerebral artery demonstrated complete opacification into the capillary and venous phases with transient cross-filling via the anterior communicating artery of the right anterior cerebral artery A2 segment and distally. Moderate spasm in the mid left internal carotid artery responded to 2 aliquots of 25 mcg nitroglycerin. Angioplasty segment of the left internal carotid proximally continued to be patent to approximately 70% with no evidence of irregular filling defects except for the plaque anteriorly which was stable for 60 minutes. It was decided not to place a stent at the angioplasty for now. The balloon guide was removed. The 8 [place] Pinnacle sheath was replaced with an 8 [place] Angio-Seal closure device. Distal pulses remained Dopplerable in both feet at the end of the procedure. An immediate CT scan of the brain performed demonstrated contrast stain in the left putamen, extending slightly medially. Also noted was mild hyperattenuation in the subarachnoid space overlying the left parietal region. No evidence of mass or midline shift noted. Patient left intubated  secondary to her preprocedural neurological deficits, and also to protect her airway. She was then transferred to the neuro ICU for post thrombectomy management. IMPRESSION: Status post endovascular complete revascularization of occluded left middle cerebral artery M1 segment with 1 pass with a 4 mm x 40 mm Solitaire X retrieval device, and contact aspiration achieving a TICI 3 aspiration. Status post endovascular revascularization of symptomatic pre occlusive stenosis of the proximal left internal carotid artery with balloon angioplasty with an approximately 70% patency post angioplasty. PLAN: Follow-up in clinic 2 weeks post discharge. Ultrasound of the carotids prior to patient's discharge from the hospital to evaluate left internal carotid artery proximally. Continue with aspirin 81 mg a day, and Plavix 75 mg a day. Electronically Signed   By: Luanne Bras M.D.   On: 04/11/2021 09:08    Labs:  CBC: Recent Labs    04/08/21 2229 04/09/21 0529 04/10/21 0409 04/11/21 0336  WBC 10.4 14.2* 9.9 10.1  HGB 15.0 13.4 11.9* 10.6*  HCT 43.0 37.9 34.7* 31.1*  PLT 326 325 262 257     COAGS: Recent Labs    04/08/21 1214  INR 1.0  APTT 26     BMP: Recent Labs    04/08/21 1214 04/08/21 1230 04/08/21 2228 04/08/21 2229 04/09/21 0529 04/10/21 0409 04/11/21 0336  NA 134* 133* 135  --  136 140 136  K 4.3 4.3 3.0*  --  3.4* 2.9* 3.8  CL 97* 100  --   --  102 105 107  CO2 22  --   --   --  '23 26 22  '$ GLUCOSE 110* 105*  --   --  126* 102* 113*  BUN 12 14  --   --  6* 6* 11  CALCIUM 10.3  --   --   --  8.1* 8.0* 8.0*  CREATININE 0.73 0.70  --  0.51 0.61 0.71 0.71  GFRNONAA >60  --   --  >60 >60 >60 >60     LIVER FUNCTION TESTS: Recent Labs    04/08/21 1214  BILITOT 0.6  AST 22  ALT 16  ALKPHOS 54  PROT 7.5  ALBUMIN 4.4     Assessment and Plan:  76 y.o. female. Presented on 3.4.23 with right sided weakness and aphasia. Code stroke  initiated, s/p Lt MCA contact  aspiration achieving a TICI 3 revascularization via right CFA approach.   Patient A/O x 3.  Waiting for carotid ultrasound today and may be discharged soon.   Recommendations:  - Continue Plavix 75 mg and ASA 81 mg daily - Follow up with Dr. Estanislado Pandy at [facility] in 2 weeks - order placed - No bending, stopping, lifting more than 10 pounds x 2 weeks - No driving x 2 weeks  - Keep the right groin area clean and dry, do not submerge (sitting in a tub, swimming) x 7 days    NIR will sign off.  Please call IR for questions and concerns.    Electronically Signed: Tera Mater, PA-C 04/11/2021, 9:16 AM   I spent a total of 15 Minutes at the patient's bedside AND on the patient's hospital floor or unit, greater than 50% of which was counseling/coordinating care for LT MCA M 1 seg with x1 pass with contact aspiration achieving a TICI 3 revascularization, performed by Dr. Estanislado Pandy on 04/08/21.

## 2021-04-11 NOTE — Discharge Summary (Signed)
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, s/p IR with TICI3 and angioplasty, etiology likely due to large vessel disease Active Problems: Left carotid stenosis, acute occlusion Hypertension Hyperlipidemia History of left CRAO   Allergies as of 04/11/2021       Reactions   Brimonidine Other (See Comments), Cough   Dry mouth, dizziness, very sleepy        Medication List     STOP taking these medications    amLODipine 5 MG tablet Commonly known as: NORVASC   hydrochlorothiazide 12.5 MG tablet Commonly known as: HYDRODIURIL   losartan 100 MG tablet Commonly known as: COZAAR       TAKE these medications    aspirin 81 MG chewable tablet Chew 1 tablet (81 mg total) by mouth daily. Start taking on: April 12, 2021   atorvastatin 40 MG tablet Commonly known as: LIPITOR Take 1 tablet (40 mg total) by mouth daily. Start taking on: April 12, 2021   clopidogrel 75 MG tablet Commonly known as: PLAVIX Take 1 tablet (75 mg total) by mouth daily. Start taking on: April 12, 2021   dorzolamide-timolol 22.3-6.8 MG/ML ophthalmic solution Commonly known as: COSOPT Place 1 drop into both eyes 2 (two) times daily.   latanoprost 0.005 % ophthalmic solution Commonly known as: XALATAN Place 1 drop into both eyes at bedtime.   Prolia 60 MG/ML Sosy injection Generic drug: denosumab Inject 60 mg into the skin every 6 (six) months.   VITAMIN D PO Take 20,000 Units by mouth once a week. Wednesdays        LABORATORY STUDIES CBC    Component Value Date/Time   WBC 10.1 04/11/2021 0336   RBC 3.08 (L) 04/11/2021  0336   HGB 10.6 (L) 04/11/2021 0336   HCT 31.1 (L) 04/11/2021 0336   PLT 257 04/11/2021 0336   MCV 101.0 (H) 04/11/2021 0336   MCH 34.4 (H) 04/11/2021 0336   MCHC 34.1 04/11/2021 0336   RDW 12.9 04/11/2021 0336   LYMPHSABS 2.1 04/08/2021 1214   MONOABS 0.5 04/08/2021 1214   EOSABS 0.0 04/08/2021 1214   BASOSABS 0.0 04/08/2021 1214   CMP    Component Value Date/Time   NA 136 04/11/2021 0336   K 3.8 04/11/2021 0336   CL 107 04/11/2021 0336   CO2 22 04/11/2021 0336   GLUCOSE 113 (H) 04/11/2021 0336   BUN 11 04/11/2021 0336   CREATININE 0.71 04/11/2021 0336   CALCIUM 8.0 (L) 04/11/2021 0336   PROT 7.5 04/08/2021 1214   ALBUMIN 4.4 04/08/2021 1214   AST 22 04/08/2021 1214   ALT 16 04/08/2021 1214   ALKPHOS 54 04/08/2021 1214   BILITOT 0.6 04/08/2021 1214   GFRNONAA >60 04/11/2021 0336   GFRAA >90 07/15/2011 1701   COAGS Lab Results  Component Value Date   INR 1.0 04/08/2021   Lipid Panel    Component Value Date/Time   CHOL 215 (H) 04/09/2021 0529   TRIG 149 04/09/2021 0529   HDL 82 04/09/2021 0529   CHOLHDL 2.6 04/09/2021 0529   VLDL 30 04/09/2021 0529  LDLCALC 103 (H) 04/09/2021 0529   HgbA1C  Lab Results  Component Value Date   HGBA1C 5.5 04/09/2021   Urinalysis No results found for: COLORURINE, APPEARANCEUR, LABSPEC, PHURINE, GLUCOSEU, HGBUR, BILIRUBINUR, KETONESUR, PROTEINUR, UROBILINOGEN, NITRITE, LEUKOCYTESUR Urine Drug Screen No results found for: LABOPIA, COCAINSCRNUR, LABBENZ, AMPHETMU, THCU, LABBARB  Alcohol Level No results found for: [facility]   SIGNIFICANT DIAGNOSTIC STUDIES MR BRAIN WO CONTRAST  Addendum Date: 04/09/2021   ADDENDUM REPORT: 04/09/2021 11:36 ADDENDUM: These results were called by telephone at the time of interpretation on 04/09/2021 at 11:36 am to provider Dr. Erlinda Hong, who verbally acknowledged these results. Electronically Signed   By: Kellie Simmering D.O.   On: 04/09/2021 11:36   Result Date: 04/09/2021 CLINICAL DATA:  Provided history: Neuro  deficit, acute, stroke suspected. EXAM: MRI HEAD WITHOUT CONTRAST TECHNIQUE: Multiplanar, multiecho pulse sequences of the brain and surrounding structures were obtained without intravenous contrast. COMPARISON:  Prior CTA head/neck examinations 04/08/2021, prior noncontrast head CT examinations 04/08/2021, prior brain MRI 04/08/2021. Postprocedural head CT performed yesterday. FINDINGS: Brain: Mild-to-moderate generalized cerebral and cerebellar atrophy. New from the prior brain MRI of 04/08/2021, there are acute infarcts involving much of the left caudate and lentiform nuclei, and likely small portions of the left internal capsule. Slight regional mass effect without significant ventricular effacement or midline shift. Mild petechial hemorrhage within the left basal ganglia. There are also new small cortically based infarcts within the left frontal and parietal lobes (left MCA vascular territory). There are a few punctate foci of petechial hemorrhage within the high left parietal lobe, also new from the prior MRI. There is T2 FLAIR hyperintense signal abnormality and susceptibility-weighted signal loss along portions of the left frontal, parietal and temporal lobes, compatible with small-volume acute subarachnoid hemorrhage. Redemonstrated small chronic cortical infarct within the lateral left frontoparietal lobes. Mild multifocal T2 FLAIR hyperintense signal abnormality within the cerebral white matter, nonspecific but compatible with chronic small vessel ischemic disease. No evidence of an intracranial mass. No evidence of hydrocephalus. Vascular: Maintained flow voids within the proximal large arterial vessels. Skull and upper cervical spine: No focal suspicious marrow lesion. Sinuses/Orbits: Visualized orbits show no acute finding. Prior right ocular lens replacement. Mild mucosal thickening within the bilateral ethmoid, right sphenoid and bilateral maxillary sinuses. Small mucous retention cyst within the  left sphenoid sinus. IMPRESSION: New from the prior brain MRI of 04/08/2021, there are acute infarcts involving much of the left caudate and lentiform nuclei, as well as small portions of the left internal capsule. Slight regional mass effect without ventricular effacement or midline shift. Mild petechial hemorrhage within the left basal ganglia. There are several small acute cortically-based infarcts within the left frontal and parietal lobes, which are also new from the prior MRI (left MCA vascular territory). Additionally, there are a few new punctate foci of petechial hemorrhage within the high left parietal lobe. Signal abnormality along portions of the left frontal, parietal and temporal lobes, compatible with small-volume acute subarachnoid hemorrhage. Chronic findings without interval change, as described. Paranasal sinus disease, as outlined. Electronically Signed: By: Kellie Simmering D.O. On: 04/09/2021 11:31   MR BRAIN WO CONTRAST  Result Date: 04/08/2021 CLINICAL DATA:  Provided history: Neuro deficit, acute, stroke suspected. EXAM: MRI HEAD WITHOUT CONTRAST TECHNIQUE: Multiplanar, multiecho pulse sequences of the brain and surrounding structures were obtained without intravenous contrast. COMPARISON:  CT angiogram head/neck and non-contrast head CT performed earlier today 04/08/2021. FINDINGS: Brain: Mild-to-moderate generalized cerebral and cerebellar atrophy. Punctate acute or cortical infarct within  the left parietal lobe (series 2, image 41 (left MCA vascular territory). Small chronic cortical infarct within the lateral left frontoparietal lobes (series 6, image 26). Mild multifocal T2 FLAIR hyperintense signal abnormality within the cerebral white matter, nonspecific but compatible with chronic small vessel ischemic disease. No evidence of an intracranial mass. No chronic intracranial blood products. No extra-axial fluid collection. No midline shift. Vascular: Signal abnormality within the petrous  and proximal cavernous left ICA at site of known vessel occlusion. Focus of susceptibility weighted signal loss within the left sylvian fissure, likely reflecting thrombus within an occluded proximal M2 left MCA vessel (series 7, image 53). Skull and upper cervical spine: No focal suspicious marrow lesion. Sinuses/Orbits: Visualized orbits show no acute finding. Prior left ocular lens replacement. Mild mucosal thickening within the bilateral maxillary sinuses. Trace mucosal thickening within the bilateral ethmoid sinuses. IMPRESSION: Punctate acute cortical infarct within the left parietal lobe (left MCA vascular territory). Small chronic cortically-based infarct within the lateral left frontoparietal lobes (left MCA vascular territory). Mild chronic small vessel ischemic changes within the cerebral white matter. Signal abnormality within the petrous and proximal cavernous segments of the left ICA, at site of known vessel occlusion. Focus of susceptibility-weighted signal loss within the left sylvian fissure, likely reflecting thrombus within an occluded proximal M2 left MCA vessel (as was demonstrated on the CTA head performed earlier today). Mild-to-moderate generalized cerebral and cerebellar atrophy. Paranasal sinus disease, as described. Electronically Signed   By: Kellie Simmering D.O.   On: 04/08/2021 16:19   Portable Chest x-ray  Result Date: 04/08/2021 CLINICAL DATA:  Endotracheal tube placement. EXAM: PORTABLE CHEST 1 VIEW COMPARISON:  07/15/2011. FINDINGS: The heart size and mediastinal contours are within normal limits. No consolidation, effusion, or pneumothorax. An endotracheal tube terminates 3.1 cm above the carina. An enteric tube terminates in the stomach. Multiple old rib fractures are noted on the right with bone callus formation. IMPRESSION: 1. No active disease. 2. Endotracheal and enteric tubes as described above. Electronically Signed   By: Brett Fairy M.D.   On: 04/08/2021 23:07    ECHOCARDIOGRAM COMPLETE  Result Date: 04/08/2021    ECHOCARDIOGRAM REPORT   Patient Name:   RAINY ROTHMAN Date of Exam: 04/08/2021 Medical Rec #:  [id number]   Height:       63.0 in Accession #:    [id number]  Weight:       121.3 lb Date of Birth:  [birth year]   BSA:          1.563 m Patient Age:    43 years    BP:           134/72 mmHg Patient Gender: F           HR:           87 bpm. Exam Location:  Inpatient Procedure: 2D Echo, Cardiac Doppler and Color Doppler Indications:    Stroke  History:        Patient has no prior history of Echocardiogram examinations.  Sonographer:    Merrie Roof RDCS Referring Phys: [id number] [place]  1. Left ventricular ejection fraction, by estimation, is 60 to 65%. The left ventricle has normal function. The left ventricle has no regional wall motion abnormalities. Left ventricular diastolic parameters were normal.  2. Right ventricular systolic function is normal. The right ventricular size is normal. There is normal pulmonary artery systolic pressure.  3. The mitral valve is normal in structure. No evidence of mitral valve regurgitation. No  evidence of mitral stenosis.  4. The aortic valve is normal in structure. Aortic valve regurgitation is not visualized. No aortic stenosis is present. FINDINGS  Left Ventricle: Left ventricular ejection fraction, by estimation, is 60 to 65%. The left ventricle has normal function. The left ventricle has no regional wall motion abnormalities. The left ventricular internal cavity size was normal in size. There is  no left ventricular hypertrophy. Left ventricular diastolic parameters were normal. Right Ventricle: The right ventricular size is normal. Right vetricular wall thickness was not well visualized. Right ventricular systolic function is normal. There is normal pulmonary artery systolic pressure. The tricuspid regurgitant velocity is 2.17 m/s, and with an assumed right atrial pressure of 3 mmHg, the estimated right  ventricular systolic pressure is 37.8 mmHg. Left Atrium: Left atrial size was normal in size. Right Atrium: Right atrial size was normal in size. Pericardium: There is no evidence of pericardial effusion. Mitral Valve: The mitral valve is normal in structure. No evidence of mitral valve regurgitation. No evidence of mitral valve stenosis. Tricuspid Valve: The tricuspid valve is normal in structure. Tricuspid valve regurgitation is trivial. Aortic Valve: The aortic valve is normal in structure. Aortic valve regurgitation is not visualized. No aortic stenosis is present. Aortic valve mean gradient measures 3.0 mmHg. Aortic valve peak gradient measures 6.2 mmHg. Aortic valve area, by VTI measures 1.78 cm. Pulmonic Valve: The pulmonic valve was not well visualized. Pulmonic valve regurgitation is not visualized. Aorta: The aortic root and ascending aorta are structurally normal, with no evidence of dilitation. IAS/Shunts: The atrial septum is grossly normal.  LEFT VENTRICLE PLAX 2D LVIDd:         4.00 cm   Diastology LVIDs:         2.70 cm   LV e' medial:    5.66 cm/s LV PW:         0.90 cm   LV E/e' medial:  11.4 LV IVS:        1.00 cm   LV e' lateral:   7.07 cm/s LVOT diam:     1.70 cm   LV E/e' lateral: 9.1 LV SV:         45 LV SV Index:   29 LVOT Area:     2.27 cm  RIGHT VENTRICLE RV Basal diam:  3.10 cm LEFT ATRIUM             Index        RIGHT ATRIUM          Index LA diam:        2.70 cm 1.73 cm/m   RA Area:     9.96 cm LA Vol (A2C):   40.1 ml 25.65 ml/m  RA Volume:   18.70 ml 11.96 ml/m LA Vol (A4C):   34.5 ml 22.07 ml/m LA Biplane Vol: 37.7 ml 24.12 ml/m  AORTIC VALVE AV Area (Vmax):    1.76 cm AV Area (Vmean):   1.73 cm AV Area (VTI):     1.78 cm AV Vmax:           124.00 cm/s AV Vmean:          87.200 cm/s AV VTI:            0.254 m AV Peak Grad:      6.2 mmHg AV Mean Grad:      3.0 mmHg LVOT Vmax:         96.00 cm/s LVOT Vmean:  66.500 cm/s LVOT VTI:          0.199 m LVOT/AV VTI ratio: 0.78   AORTA Ao Root diam: 2.80 cm MITRAL VALVE                TRICUSPID VALVE MV Area (PHT): 3.43 cm     TR Peak grad:   18.8 mmHg MV Decel Time: 221 msec     TR Vmax:        217.00 cm/s MV E velocity: 64.30 cm/s MV A velocity: 104.00 cm/s  SHUNTS MV E/A ratio:  0.62         Systemic VTI:  0.20 m                             Systemic Diam: 1.70 cm Mertie Moores MD Electronically signed by Mertie Moores MD Signature Date/Time: 04/08/2021/5:24:55 PM    Final    IR PERCUTANEOUS ART THROMBECTOMY/INFUSION INTRACRANIAL INC DIAG ANGIO  Result Date: 04/11/2021 CLINICAL DATA:  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 EXAM: IR PERCUTANEOUS ART THORMBECTOMY/INFUSION INTRACRANIAL INCLUDE DIAG ANGIO COMPARISON:  CT angiogram of the head and neck of April 08, 2021. MEDICATIONS: Heparin none units IV. Ancef 2 g IV antibiotic was administered within 1 hour of the procedure. ANESTHESIA/SEDATION: General anesthesia. CONTRAST:  Omnipaque 300 approximately 100 mL. FLUOROSCOPY TIME:  Fluoroscopy Time: 37 minutes 0 seconds (1495 mGy). COMPLICATIONS: None immediate. TECHNIQUE: Informed written consent was obtained from the patient's spouse after a thorough discussion of the procedural risks, benefits and alternatives. All questions were addressed. Maximal Sterile Barrier Technique was utilized including caps, mask, sterile gowns, sterile gloves, sterile drape, hand hygiene and skin antiseptic. A timeout was performed prior to the initiation of the procedure. The right groin was prepped and draped in the usual sterile fashion. Thereafter using modified Seldinger technique, transfemoral access into the right common femoral artery was obtained without difficulty. Over a 0.035 inch guidewire, an 8 [place] Pinnacle 25 cm sheath was inserted. Through this,  and also over 0.035 inch guidewire, a 5 [place] JB 1 catheter was advanced to the aortic arch region and selectively positioned in the left common carotid artery and the right common carotid artery. FINDINGS: Left common carotid arteriogram demonstrates the left external carotid artery and its major branches to be widely patent. The left internal carotid artery at the bulb has a severe high-grade pre occlusive stenosis with a smooth filling defect at the bulb. Slow ascent of contrast is seen in the left internal carotid artery to the cranial skull base decompressed vessel. 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 ENDOVASCULAR REVASCULARIZATION OF LEFT INTERNAL CAROTID ARTERY PROXIMALLY, AND LEFT MIDDLE CEREBRAL ARTERY Diagnostic JB 1 catheter in the left common carotid artery was then exchanged for a 0.035 inch 300 cm Rosen exchange guidewire for a 95 cm 087 balloon guide catheter which had been prepped with 50% contrast and 50% heparinized saline infusion. The guidewire was removed. Brisk aspiration was achieved at the hub of the balloon guide catheter. A gentle control arteriogram performed through the balloon guide catheter demonstrated no evidence of spasms, dissections or of intraluminal filling defects. Over a 0.014 inch standard Synchro micro guidewire with a moderate J configuration, an 021 162 cm microcatheter was  then advanced to the distal end of the balloon guide catheter in the distal left common carotid artery. Using a torque device and constant roadmap technique, micro guidewire was then gently advanced without any difficulty to the petrous cavernous junction of the left internal carotid artery followed by the microcatheter. The guidewire was removed. Good aspiration obtained from the hub of the microcatheter. Gentle control arteriogram performed through the microcatheter continued to demonstrate a left M1 proximal occlusion. Microcatheter was then replaced by a  300 cm Synchro standard 014 inch exchange micro guidewire with a mild J configuration using biplane roadmap technique and constant fluoroscopic guidance. A 4 mm x 20 mm Viatrac 14 balloon angioplasty catheter which had been prepped retrogradely with heparinized saline infusion and intracranially with 50% contrast and 50% heparinized saline infusion was advanced to the left internal carotid artery proximally and its marker was placed adequate distant from the site of the severe stenosis. Control inflation was then performed using a micro inflation syringe device via micro tubing to 8 atmospheres for approximately 30 seconds. Prior to the angioplasty, proximal flow arrest was initiated by inflating the balloon of the balloon guide catheter in the distal left common carotid artery. Also during this time constant aspiration was applied at the hub of the balloon guide catheter with a Penumbra pump. Aspiration was continued as the balloon was deflated in the left common carotid artery. The catheter was retrieved and removed. Control arteriogram performed through the balloon guide catheter now demonstrates significantly improved caliber and flow through angioplastied segment. Approximately 70% patency was evident angiographically at the proximal left internal carotid artery. Over the exchange micro guidewire, an 021 162 cm microcatheter was then advanced inside of a 132 cm 55 cm Zoom aspiration catheter to the supraclinoid left ICA. The exchange micro guidewire was removed. Good aspiration obtained from the hub of the microcatheter. Over an 014 inch standard Synchro micro with a moderate J configuration, the microcatheter was advanced into the distal M2 M3 region of the inferior division followed by the microcatheter. The guidewire was removed. Good aspiration obtained from the hub of the microcatheter a gentle control arteriogram performed through the microcatheter demonstrated safe positioning of the tip of the  microcatheter. A 4 mm x 40 mm Solitaire X retrieval device was then advanced to the distal end of the microcatheter. This was then deployed in the usual manner with proximal portion of the retrieval device just proximal to the occluded left MCA. The 20 Zoom aspiration catheter was then advanced to engage the proximal portion of the left middle cerebral artery occlusion. Thereafter with proximal flow arrest in the left internal carotid artery with constant aspiration applied with a 20 mL syringe at the hub of the balloon guide catheter and the Zoom aspiration catheter with a Penumbra pump for approximately 2-1/2 minutes, the combination of the retrieval device, the microcatheter and the Zoom aspiration catheter was retrieved and removed. Following reversal of flow arrest in the left internal carotid artery a control arteriogram performed through the balloon guide catheter now demonstrated complete revascularization of the left middle cerebral artery distribution achieving a TICI 3 revascularization. The left anterior cerebral artery demonstrated complete opacification into the capillary and venous phases with transient cross-filling via the anterior communicating artery of the right anterior cerebral artery A2 segment and distally. Moderate spasm in the mid left internal carotid artery responded to 2 aliquots of 25 mcg nitroglycerin. Angioplasty segment of the left internal carotid proximally continued to be patent to approximately  70% with no evidence of irregular filling defects except for the plaque anteriorly which was stable for 60 minutes. It was decided not to place a stent at the angioplasty for now. The balloon guide was removed. The 8 [place] Pinnacle sheath was replaced with an 8 [place] Angio-Seal closure device. Distal pulses remained Dopplerable in both feet at the end of the procedure. An immediate CT scan of the brain performed demonstrated contrast stain in the left putamen, extending slightly  medially. Also noted was mild hyperattenuation in the subarachnoid space overlying the left parietal region. No evidence of mass or midline shift noted. Patient left intubated secondary to her preprocedural neurological deficits, and also to protect her airway. She was then transferred to the neuro ICU for post thrombectomy management. IMPRESSION: Status post endovascular complete revascularization of occluded left middle cerebral artery M1 segment with 1 pass with a 4 mm x 40 mm Solitaire X retrieval device, and contact aspiration achieving a TICI 3 aspiration. Status post endovascular revascularization of symptomatic pre occlusive stenosis of the proximal left internal carotid artery with balloon angioplasty with an approximately 70% patency post angioplasty. PLAN: Follow-up in clinic 2 weeks post discharge. Ultrasound of the carotids prior to patient's discharge from the hospital to evaluate left internal carotid artery proximally. Continue with aspirin 81 mg a day, and Plavix 75 mg a day. Electronically Signed   By: Luanne Bras M.D.   On: 04/11/2021 09:08   CT HEAD CODE STROKE WO CONTRAST  Result Date: 04/08/2021 CLINICAL DATA:  Code stroke. Provided history: Neuro deficit, acute, stroke suspected. EXAM: CT HEAD WITHOUT CONTRAST TECHNIQUE: Contiguous axial images were obtained from the base of the skull through the vertex without intravenous contrast. RADIATION DOSE REDUCTION: This exam was performed according to the departmental dose-optimization program which includes automated exposure control, adjustment of the mA and/or kV according to patient size and/or use of iterative reconstruction technique. COMPARISON:  Brain MRI 04/08/2021. Non-contrast head CT and CT angiogram head/neck performed earlier today 04/08/2021. FINDINGS: Brain: Mild-to-moderate generalized cerebral atrophy. Comparatively mild cerebellar atrophy. A punctate acute cortical infarct within the left parietal lobe was better  appreciated on the brain MRI performed earlier today. A small chronic cortical infarct within the lateral left frontoparietal lobes was also better appreciated on this prior examination. Mild patchy and ill-defined hypoattenuation within the cerebral white matter, nonspecific but compatible with chronic small vessel ischemic disease. There is no acute intracranial hemorrhage. No extra-axial fluid collection. No evidence of an intracranial mass. No midline shift. Vascular: Occlusion of the left ICA within the neck and at the petrous segment was demonstrated on the CTA head/neck performed earlier today. Occlusion of a proximal M2 left MCA vessel was also demonstrated on the head CTA performed earlier today. Atherosclerotic calcifications. Skull: Normal. Negative for fracture or focal lesion. Sinuses/Orbits: Visualized orbits show no acute finding. Mild mucosal thickening within the bilateral maxillary sinuses. ASPECTS [facility] Stroke Program Early CT Score) - Ganglionic level infarction (caudate, lentiform nuclei, internal capsule, insula, M1-M3 cortex): 7 - Supraganglionic infarction (M4-M6 cortex): 3 Total score (0-10 with 10 being normal): 10 These results were communicated to Dr. Lorrin Goodell At 6:58 pmon 3/4/2023by text page via the [facility] messaging system. IMPRESSION: A punctate acute cortical infarct within the left parietal lobe was better appreciated on the brain MRI performed earlier today. No evidence of acute intracranial hemorrhage. Chronic findings without appreciable interval change from the brain MRI performed earlier today, as described. Mild mucosal thickening within the bilateral maxillary sinuses.  Electronically Signed   By: Kellie Simmering D.O.   On: 04/08/2021 18:58   CT HEAD CODE STROKE WO CONTRAST  Result Date: 04/08/2021 CLINICAL DATA:  Code stroke. Neuro deficit, acute, stroke suspected. Right-sided weakness. EXAM: CT HEAD WITHOUT CONTRAST TECHNIQUE: Contiguous axial images were obtained from  the base of the skull through the vertex without intravenous contrast. RADIATION DOSE REDUCTION: This exam was performed according to the departmental dose-optimization program which includes automated exposure control, adjustment of the mA and/or kV according to patient size and/or use of iterative reconstruction technique. COMPARISON:  No pertinent prior exams available for comparison. FINDINGS: Brain: Mild-to-moderate generalized cerebral and cerebellar atrophy. Mild ill-defined hypoattenuation within the periventricular white matter, nonspecific but compatible chronic small vessel ischemic disease. There is no acute intracranial hemorrhage. No demarcated cortical infarct. No extra-axial fluid collection. No evidence of an intracranial mass. No midline shift. Vascular: No hyperdense vessel.  Atherosclerotic calcifications. Skull: Normal. Negative for fracture or focal lesion. Sinuses/Orbits: Visualized orbits show no acute finding. No significant paranasal sinus disease at the imaged levels. ASPECTS [facility] Stroke Program Early CT Score) - Ganglionic level infarction (caudate, lentiform nuclei, internal capsule, insula, M1-M3 cortex): 7 - Supraganglionic infarction (M4-M6 cortex): 3 Total score (0-10 with 10 being normal): 10 These results were communicated to Dr. Erlinda Hong At 12:33 pmon 3/4/2023by text page via the [facility] messaging system. IMPRESSION: No evidence of acute intracranial abnormality. Mild chronic small vessel given changes within the cerebral white matter. Mild-to-moderate generalized parenchymal atrophy. Electronically Signed   By: Kellie Simmering D.O.   On: 04/08/2021 12:33   VAS US CAROTID  Result Date: 04/11/2021 Carotid Arterial Duplex Study Patient Name:  JENILLE LASZLO  Date of Exam:   04/11/2021 Medical Rec #: [id number]    Accession #:    [id number] Date of Birth: 1945-09-23    Patient Gender: F Patient Age:   33 years Exam Location:  [facility] Procedure:      VAS US CAROTID Referring Phys:  Rosalin Hawking --------------------------------------------------------------------------------  Indications:       CVA and Follow up 04-08-2021 Endovascular revascularization                    of the left ICA proximally and left MCA. Risk Factors:      Hypertension, hyperlipidemia, past history of smoking. Comparison Study:  04-08-2021 Most recent CTA neck results: The intracranial                    left internal carotid artery is now patent.                    However, there is persistent severe narrowing of the petrous                    and                    proximal cavernous segments. Performing Technologist: Darlin Coco RDMS, RVT  Examination Guidelines: A complete evaluation includes B-mode imaging, spectral Doppler, color Doppler, and power Doppler as needed of all accessible portions of each vessel. Bilateral testing is considered an integral part of a complete examination. Limited examinations for reoccurring indications may be performed as noted.  Right Carotid Findings: +----------+--------+--------+--------+------------------+--------+             PSV cm/s EDV cm/s Stenosis Plaque Description Comments  +----------+--------+--------+--------+------------------+--------+  CCA Prox   63       16                                             +----------+--------+--------+--------+------------------+--------+  CCA Distal 66       18                                             +----------+--------+--------+--------+------------------+--------+  ICA Prox   101      28       1-39%    calcific                     +----------+--------+--------+--------+------------------+--------+  ICA Distal 117      40                                             +----------+--------+--------+--------+------------------+--------+  ECA        108      6                 heterogenous                 +----------+--------+--------+--------+------------------+--------+ +----------+--------+-------+----------------+-------------------+              PSV cm/s EDV cms Describe         Arm Pressure (mmHG)  +----------+--------+-------+----------------+-------------------+  Subclavian 82               Multiphasic, WNL                      +----------+--------+-------+----------------+-------------------+ +---------+--------+--+--------+-+---------+  Vertebral PSV cm/s 47 EDV cm/s 9 Antegrade  +---------+--------+--+--------+-+---------+  Left Carotid Findings: +----------+--------+--------+--------+------------------------------+---------+             PSV cm/s EDV cm/s Stenosis Plaque Description             Comments   +----------+--------+--------+--------+------------------------------+---------+  CCA Prox   119      21                                                          +----------+--------+--------+--------+------------------------------+---------+  CCA Distal 88       21                                                          +----------+--------+--------+--------+------------------------------+---------+  ICA Prox   213      66       60-79%   calcific and                                                                     heterogenous/hypoechoic                   +----------+--------+--------+--------+------------------------------+---------+  ICA Mid    183      49  turbulent  +----------+--------+--------+--------+------------------------------+---------+  ICA Distal 62       19                                                          +----------+--------+--------+--------+------------------------------+---------+  ECA        80                                                                   +----------+--------+--------+--------+------------------------------+---------+ +----------+--------+--------+----------------+-------------------+             PSV cm/s EDV cm/s Describe         Arm Pressure (mmHG)  +----------+--------+--------+----------------+-------------------+  Subclavian 154                Multiphasic, WNL                      +----------+--------+--------+----------------+-------------------+ +---------+--------+--+--------+--+---------+  Vertebral PSV cm/s 58 EDV cm/s 14 Antegrade  +---------+--------+--+--------+--+---------+   Summary: Right Carotid: Velocities in the right ICA are consistent with a 1-39% stenosis. Left Carotid: Velocities in the left ICA are consistent with a 60-79% stenosis. Vertebrals:  Bilateral vertebral arteries demonstrate antegrade flow. Subclavians: Normal flow hemodynamics were seen in bilateral subclavian              arteries. *See table(s) above for measurements and observations.     Preliminary    CT ANGIO HEAD NECK W WO CM W PERF  Result Date: 04/08/2021 CLINICAL DATA:  Code stroke. EXAM: CT ANGIOGRAPHY HEAD AND NECK CT PERFUSION BRAIN TECHNIQUE: Multidetector CT imaging of the head and neck was performed using the standard protocol during bolus administration of intravenous contrast. Multiplanar CT image reconstructions and MIPs were obtained to evaluate the vascular anatomy. Carotid stenosis measurements (when applicable) are obtained utilizing NASCET criteria, using the distal internal carotid diameter as the denominator. Multiphase CT imaging of the brain was performed following IV bolus contrast injection. Subsequent parametric perfusion maps were calculated using RAPID software. RADIATION DOSE REDUCTION: This exam was performed according to the departmental dose-optimization program which includes automated exposure control, adjustment of the mA and/or kV according to patient size and/or use of iterative reconstruction technique. CONTRAST:  163m OMNIPAQUE IOHEXOL 350 MG/ML SOLN COMPARISON:  Noncontrast head CT examinations, CT angiogram head/neck and brain MRI performed earlier today 04/08/2021. FINDINGS: CTA NECK FINDINGS Aortic arch: Standard aortic branching. Atherosclerotic plaque within the proximal major branch vessels of the neck. Streak and  beam hardening artifact arising from a dense right-sided contrast bolus partially obscures the right subclavian artery. Within this limitation, there is no hemodynamically significant innominate or proximal subclavian artery stenosis. Right carotid system: CCA and ICA patent within the neck without significant stenosis (50% or greater). Mild atherosclerotic plaque about the carotid bifurcation. Left carotid system: The common carotid artery is patent. Atherosclerotic plaque about the carotid bifurcation. There has been interval reconstitution of flow within the cervical left ICA, although this vessel remains diminutive. Additionally, there is eccentric hypodensity within the vessel lumen at the ICA origin (which may reflect soft plaque or thrombus) with resultant severe  focal stenosis at this site. Vertebral arteries: The vertebral arteries are codominant and patent within the neck without stenosis. Skeleton: Cervical spondylosis. No acute bony abnormality or aggressive osseous lesion. Grade 1 anterolisthesis at C2-C3, C3-C4 and C4-C5. Other neck: Subcentimeter thyroid nodules, not meeting consensus criteria for ultrasound follow-up based on size. Upper chest: No consolidation within the imaged lung apices. Review of the MIP images confirms the above findings CTA HEAD FINDINGS Anterior circulation: The intracranial left ICA is now patent throughout. However, there is persistent severe narrowing of the petrous and proximal cavernous segments. New from the prior exam, subtle linear thrombus is questioned within the distal cavernous left ICA (for instance as seen on series 7, images 129 and 130). 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 There is some reconstitution of enhancement within M2 and more distal left MCA vessels. The intracranial right ICA is patent. Mild nonstenotic calcified plaque within this vessel. The M1 right middle cerebral artery is  patent. No right M2 proximal branch occlusion or high-grade proximal stenosis is identified. The anterior cerebral arteries are patent. Posterior circulation: The intracranial vertebral arteries are patent. The basilar artery is patent. Short segment fenestration within the proximal basilar artery. The posterior cerebral arteries are patent. Posterior communicating arteries are diminutive or absent bilaterally. Distal branch atherosclerotic irregularity of the right PCA. Venous sinuses: Within the limitations of contrast timing, no convincing thrombus. Anatomic variants: As described. Review of the MIP images confirms the above findings CT Brain Perfusion Findings: CBF (<30%) Volume: 21m Perfusion (Tmax>6.0s) volume: 855mMismatch Volume: 8056mnfarction Location:Left MCA vascular territory These results were called by telephone at the time of interpretation on 04/08/2021 at 7:03 pm to [facility] these results. IMPRESSION: CTA neck: Since the CTA performed earlier today, there has been interval reconstitution of flow within the cervical left ICA. However, this vessel remains diminutive. Additionally, there is eccentric hypodensity within the vessel lumen at the left ICA origin (which may reflect soft plaque or thrombus) resulting in severe focal stenosis at this site. CTA head: The intracranial left internal carotid artery is now patent. However, there is persistent severe narrowing of the petrous and proximal cavernous segments. New from the prior exam, subtle linear thrombus is questioned within the distal cavernous left ICA. 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 CT perfusion head: The perfusion software identifies core infarcts within the left MCA vascular territory totaling 6 mL. The perfusion software identifies critically hypoperfused parenchyma within the left MCA vascular territory totaling 86 mL  (utilizing the Tmax>6 seconds threshold). Reported mismatch volume: 80 mL. Electronically Signed   By: KylKellie SimmeringO.   On: 04/08/2021 19:23   CT ANGIO HEAD NECK W WO CM (CODE STROKE)  Result Date: 04/08/2021 CLINICAL DATA:  Provided history: Code stroke. Right-sided weakness. EXAM: CT ANGIOGRAPHY HEAD AND NECK TECHNIQUE: Multidetector CT imaging of the head and neck was performed using the standard protocol during bolus administration of intravenous contrast. Multiplanar CT image reconstructions and MIPs were obtained to evaluate the vascular anatomy. Carotid stenosis measurements (when applicable) are obtained utilizing NASCET criteria, using the distal internal carotid diameter as the denominator. RADIATION DOSE REDUCTION: This exam was performed according to the departmental dose-optimization program which includes automated exposure control, adjustment of the mA and/or kV according to patient size and/or use of iterative reconstruction technique. CONTRAST:  31m OMNIPAQUE IOHEXOL 350 MG/ML SOLN COMPARISON:  Noncontrast head CT performed earlier today 04/08/2021. FINDINGS: CTA NECK FINDINGS Aortic arch: Standard aortic branching. Atherosclerotic plaque within the visualized aortic arch and proximal major branch vessels of the neck. Streak and beam hardening artifact arising from a dense right-sided contrast bolus partially obscures the right subclavian artery. Within this limitation, there is no appreciable hemodynamically significant stenosis within the innominate or proximal subclavian arteries. Right carotid system: CCA and ICA patent within the neck without significant stenosis (50% or greater). Mild atherosclerotic plaque about the carotid bifurcation. Left carotid system: The common carotid artery is patent. 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 Atherosclerotic plaque within the distal CCA and about the carotid bifurcation.  Vertebral arteries: Vertebral arteries codominant and patent within the neck without stenosis. Skeleton: Cervical spondylosis. No acute bony abnormality or aggressive osseous lesion. Grade 1 anterolisthesis at C2-C3, C3-C4 and C4-C5. Other neck: Subcentimeter thyroid nodules, not meeting consensus criteria for ultrasound follow-up based on size. Upper chest: No consolidation within the imaged lung apices. Review of the MIP images confirms the above findings CTA HEAD FINDINGS Anterior circulation: The intracranial right ICA is patent. 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 There is reconstitution of enhancement within the intracranial left ICA beginning at the cavernous segment. The M1 middle cerebral arteries are patent. There is occlusion of a proximal M2 left MCA vessel (series 12, image 28). The anterior cerebral arteries are patent. No intracranial aneurysm is identified. Posterior circulation: The intracranial vertebral arteries are patent. The basilar artery is patent. Short segment fenestration within the proximal basilar artery. The posterior cerebral arteries are patent. Posterior communicating arteries are diminutive or absent bilaterally. Venous sinuses: Within the limitations of contrast timing, no convincing thrombus. Anatomic variants: As described. Review of the MIP images confirms the above findings CTA head impression called by telephone at the time of interpretation on 04/08/2021 at 1:03 pm to [facility], who verbally acknowledged these results. IMPRESSION: CTA neck: 1. The left common carotid artery is patent. 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 2. The right common carotid, right internal carotid and bilateral vertebral arteries are patent within the neck without hemodynamically significant stenosis. 3.  Aortic Atherosclerosis (ICD10-I70.0). CTA head: 1. The intracranial  left ICA remains occluded throughout the petrous segment. There is reconstitution of enhancement within the intracranial left ICA beginning at the cavernous segment. 2. There is occlusion of a proximal M2 left MCA vessel (series 12, image 28). Electronically Signed   By: KKellie SimmeringD.O.   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 EXAM: IR PERCUTANEOUS ART THORMBECTOMY/INFUSION INTRACRANIAL INCLUDE DIAG ANGIO COMPARISON:  CT angiogram of the head and neck of April 08, 2021. MEDICATIONS: Heparin none units IV. Ancef 2 g IV antibiotic was administered within 1 hour of the procedure. ANESTHESIA/SEDATION: General anesthesia. CONTRAST:  Omnipaque 300 approximately 100 mL. FLUOROSCOPY TIME:  Fluoroscopy Time: 37 minutes 0 seconds (1495 mGy). COMPLICATIONS: None immediate. TECHNIQUE: Informed written consent was obtained from the patient's spouse after a  thorough discussion of the procedural risks, benefits and alternatives. All questions were addressed. Maximal Sterile Barrier Technique was utilized including caps, mask, sterile gowns, sterile gloves, sterile drape, hand hygiene and skin antiseptic. A timeout was performed prior to the initiation of the procedure. The right groin was prepped and draped in the usual sterile fashion. Thereafter using modified Seldinger technique, transfemoral access into the right common femoral artery was obtained without difficulty. Over a 0.035 inch guidewire, an 8 [place] Pinnacle 25 cm sheath was inserted. Through this, and also over 0.035 inch guidewire, a 5 [place] JB 1 catheter was advanced to the aortic arch region and selectively positioned in the left common  carotid artery and the right common carotid artery. FINDINGS: Left common carotid arteriogram demonstrates the left external carotid artery and its major branches to be widely patent. The left internal carotid artery at the bulb has a severe high-grade pre occlusive stenosis with a smooth filling defect at the bulb. Slow ascent of contrast is seen in the left internal carotid artery to the cranial skull base decompressed vessel. 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 ENDOVASCULAR REVASCULARIZATION OF LEFT INTERNAL CAROTID ARTERY PROXIMALLY, AND LEFT MIDDLE CEREBRAL ARTERY Diagnostic JB 1 catheter in the left common carotid artery was then exchanged for a 0.035 inch 300 cm Rosen exchange guidewire for a 95 cm 087 balloon guide catheter which had been prepped with 50% contrast and 50% heparinized saline infusion. The guidewire was removed. Brisk aspiration was achieved at the hub of the balloon guide catheter. A gentle control arteriogram performed through the balloon guide catheter demonstrated no evidence of spasms, dissections or of intraluminal filling defects. Over a 0.014 inch standard Synchro micro guidewire with a moderate J configuration, an 021 162 cm microcatheter was then advanced to the distal end of the balloon guide catheter in the distal left common carotid artery. Using a torque device and constant roadmap technique, micro guidewire was then gently advanced without any difficulty to the petrous cavernous junction of the left internal carotid artery followed by the microcatheter. The guidewire was removed. Good aspiration obtained from the hub of the microcatheter. Gentle control arteriogram performed through the microcatheter continued to demonstrate a left M1 proximal occlusion. Microcatheter was then replaced by a 300 cm Synchro standard 014 inch exchange micro guidewire with a mild J configuration using biplane roadmap technique and constant fluoroscopic  guidance. A 4 mm x 20 mm Viatrac 14 balloon angioplasty catheter which had been prepped retrogradely with heparinized saline infusion and intracranially with 50% contrast and 50% heparinized saline infusion was advanced to the left internal carotid artery proximally and its marker was placed adequate distant from the site of the severe stenosis. Control inflation was then performed using a micro inflation syringe device via micro tubing to 8 atmospheres for approximately 30 seconds. Prior to the angioplasty, proximal flow arrest was initiated by inflating the balloon of the balloon guide catheter in the distal left common carotid artery. Also during this time constant aspiration was applied at the hub of the balloon guide catheter with a Penumbra pump. Aspiration was continued as the balloon was deflated in the left common carotid artery. The catheter was retrieved and removed. Control arteriogram performed through the balloon guide catheter now demonstrates significantly improved caliber and flow through angioplastied segment. Approximately 70% patency was evident angiographically at the proximal left internal carotid artery. Over the exchange micro guidewire, an 021 162 cm microcatheter was then advanced inside  of a 132 cm 55 cm Zoom aspiration catheter to the supraclinoid left ICA. The exchange micro guidewire was removed. Good aspiration obtained from the hub of the microcatheter. Over an 014 inch standard Synchro micro with a moderate J configuration, the microcatheter was advanced into the distal M2 M3 region of the inferior division followed by the microcatheter. The guidewire was removed. Good aspiration obtained from the hub of the microcatheter a gentle control arteriogram performed through the microcatheter demonstrated safe positioning of the tip of the microcatheter. A 4 mm x 40 mm Solitaire X retrieval device was then advanced to the distal end of the microcatheter. This was then deployed in the usual  manner with proximal portion of the retrieval device just proximal to the occluded left MCA. The 65 Zoom aspiration catheter was then advanced to engage the proximal portion of the left middle cerebral artery occlusion. Thereafter with proximal flow arrest in the left internal carotid artery with constant aspiration applied with a 20 mL syringe at the hub of the balloon guide catheter and the Zoom aspiration catheter with a Penumbra pump for approximately 2-1/2 minutes, the combination of the retrieval device, the microcatheter and the Zoom aspiration catheter was retrieved and removed. Following reversal of flow arrest in the left internal carotid artery a control arteriogram performed through the balloon guide catheter now demonstrated complete revascularization of the left middle cerebral artery distribution achieving a TICI 3 revascularization. The left anterior cerebral artery demonstrated complete opacification into the capillary and venous phases with transient cross-filling via the anterior communicating artery of the right anterior cerebral artery A2 segment and distally. Moderate spasm in the mid left internal carotid artery responded to 2 aliquots of 25 mcg nitroglycerin. Angioplasty segment of the left internal carotid proximally continued to be patent to approximately 70% with no evidence of irregular filling defects except for the plaque anteriorly which was stable for 60 minutes. It was decided not to place a stent at the angioplasty for now. The balloon guide was removed. The 8 French Pinnacle sheath was replaced with an 8 [place] Angio-Seal closure device. Distal pulses remained Dopplerable in both feet at the end of the procedure. An immediate CT scan of the brain performed demonstrated contrast stain in the left putamen, extending slightly medially. Also noted was mild hyperattenuation in the subarachnoid space overlying the left parietal region. No evidence of mass or midline shift noted. Patient  left intubated secondary to her preprocedural neurological deficits, and also to protect her airway. She was then transferred to the neuro ICU for post thrombectomy management. IMPRESSION: Status post endovascular complete revascularization of occluded left middle cerebral artery M1 segment with 1 pass with a 4 mm x 40 mm Solitaire X retrieval device, and contact aspiration achieving a TICI 3 aspiration. Status post endovascular revascularization of symptomatic pre occlusive stenosis of the proximal left internal carotid artery with balloon angioplasty with an approximately 70% patency post angioplasty. PLAN: Follow-up in clinic 2 weeks post discharge. Ultrasound of the carotids prior to patient's discharge from the hospital to evaluate left internal carotid artery proximally. Continue with aspirin 81 mg a day, and Plavix 75 mg a day. Electronically Signed   By: Luanne Bras M.D.   On: 04/11/2021 09:08      HISTORY OF PRESENT ILLNESS Fernando Masters is a 76 y.o. female with PMH significant for hypertension, left CRAO, left glaucoma and legally blind in the left eye who presented to the ED with right facial droop, right-sided weakness and  aphasia.   Her symptoms had mostly resolved in the ED with just some mild clumsiness on the right.   She was seen around 1749 and was at her baseline.  Around 1820, patient was noted to have right facial droop and right-sided weakness along with aphasia.  The case was discussed with me by RN and a code stroke was immediately reactivated.   Repeat CTA with internal reconstitution of flow within the cervical left ICA.  However, the vessel remains diminutive.  There is eccentric hypodensity within the vessel lumen of the left ICA origin which may reflect soft plaque or thrombus resulting in severe focal stenosis at the site.  The intracranial left ICA demonstrated severe persistent narrowing of the petrous and the proximal cavernous segment with subtle linear ?thrombus in  the distal cavernous left ICA.  The left MCA M1 is occluded shortly beyond its origin. CT perfusion demonstrates a core of 6 mL and a penumbra of 80 mL.   LKW: Probably 11:45 AM but 11 AM is a definite LSN mRS: 1 tNKASE: Not offered, outside the window at the time of my evaluation. Thrombectomy: Yes. Dr. Estanislado Pandy discussed with patient's husband the details along with risks and benefits of endovascular revascularization. Patient's husband consented to the procedure.   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 No evidence of acute ICH. CTA head: Intracranial L ICA occlusion throughout the petrous segment. There is reconstitution of enhancement within the intracranial L ICA beginning at the cavernous segment. Occlusion of proximal M2 L MCA vessel   CTA neck: L CCA is patent, however L ICA is occluded beginning at its origin and remains occluded throughout the remainder of the neck. R CCA, R IC, b/l vertebral arteries are patent without significant stenosis.   CT perfusion - 6/86 MRI: Punctate acute cortical infarct within the left parietal lobe (left MCA vascular territory). Small chronic cortically-based infarct within the lateral left frontoparietal lobes (left MCA vascular territory). Repeat MRI: New from the prior brain MRI of 04/08/2021, there are acute infarcts involving much of the left caudate and lentiform nuclei, as well as small portions of the left internal capsule. Mild petechial hemorrhage within the left basal ganglia. Several small acute cortically-based infarcts within the left frontal and parietal lobes, which are also new from the prior MRI (left MCA vascular territory). Additionally, there are a few new punctate foci of petechial hemorrhage within the high left  parietal lobe. Signal abnormality along portions of the left frontal, parietal and temporal lobes, compatible with small-volume acute subarachnoid hemorrhage Carotid Doppler left ICA 60-79% stenosis 2D Echo: EF 60-65%. 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 Estanislado Pandy, pt will be seen in 7 days as outpt and schedule for left ICA stenting   Hypertension Home meds:  Amlodpine 5 mg daily, HCTZ 12.5 mg daily, losartan 100 mg daily.  All held Stable Avoid low BP Long-term BP goal 120-150 given ICA stenosis   Hyperlipidemia Home meds:  N/A LDL 103, goal < 70 Add Atorvastatin 40 mg  Continue statin at discharge   Other Stroke Risk Factors Advanced Age >/= 2  Former Cigarette smoker, advised to continue abstinence  Family hx stroke (father)  DISCHARGE EXAM Blood pressure 106/65, pulse 70, temperature 98.6 F (37 C), temperature source Oral, resp. rate 16, height '5\' 2"'$  (1.575 m), weight 55.8 kg, SpO2 97 %.  Constitutional: Appears well-developed and well-nourished.  Psych: Affect appropriate to situation Eyes: No scleral injection Respiratory: Effort normal, non-labored breathing   Neuro: Mental Status: Patient is awake, alert, oriented to person, place, month, year, and situation. Patient no signs of aphasia or neglect.  Cranial Nerves: II: Visual Fields are full. Pupils are equal, round, and reactive to light. OS legal blindness only able to see HW III,IV, VI: EOMI without ptosis or diploplia. V: Facial  sensation is symmetric to temperature VII: Facial movement is symmetric resting and smiling VIII: Hearing is intact to voice XI: Shoulder shrug is symmetric. XII: Tongue protrudes midline without atrophy or fasciculations Motor: Tone is normal. Bulk is normal. 5/5 strength was present in left extremities and RUE. 4+/5 strength in right lower extremities.  Sensory: Sensation is symmetric to light touch and temperature in the arms and legs. No extinction to DSS present.   Discharge Diet       Diet   Diet Heart Room service appropriate? Yes with Assist; Fluid consistency: Thin   liquids  DISCHARGE PLAN Disposition:  home with outpt PT aspirin 81 mg daily and clopidogrel 75 mg daily for secondary stroke prevention for now. Ongoing stroke risk factor control by Primary Care Physician at time of discharge Follow-up PCP Carol Ada, MD in 2 weeks. Follow-up in [facility] in 4 weeks, office to schedule an appointment.   Discharge instruction to patient You are admitted for stroke on the left brain due to left carotid artery occlusion.  We had a procedure open up the vessel on the left brain and also left carotid artery.  However, left carotid artery still has 70% narrowing, interventional radiology Dr. Estanislado Pandy will see you in 7 to 10 days at office to schedule further treatment on the left carotid artery. If you did not get a phone call from him, please call back the number on your discharge paper to schedule appointment. Continue aspirin 81 mg and Plavix 75 mg daily, continue Lipitor 40 mg daily. Avoid low blood pressure, your blood pressure goal 120-140.  Currently, we will hold off all your blood pressure medications at this time since your blood pressure is in the goal range.  Check blood pressure at home and monitor.  If not in the goal range persistently, please call your family doctor to make medication arrangement. Recommend home rest for 1 to 2  weeks with light activity, after that gradually increase your activity but do not over exercising. Scheduled to follow-up with your family doctor in 2 weeks. You will have outpatient physical therapy appointment for further rehab. You will follow-up with neurologist at [facility] in about 1 months.  The office number is in your discharge paperwork, if you do not get a call for appointment please call them back. Any recurrent strokelike symptoms, please call 911 or go to the nearest emergency room for evaluation.  40 minutes were spent preparing discharge.  Rosalin Hawking, MD PhD Stroke Neurology 04/11/2021  5:47 PM

## 2021-04-11 NOTE — Progress Notes (Signed)
STROKE TEAM PROGRESS NOTE  ? ?INTERVAL HISTORY ?Husband and son at bedside. Patient was seen sitting up in bed, resting comfortably, awake and alert. Carotid doppler planned for today, with possible stenting if severely occluded. Residual right LE weakness, 4/5. Endorsed increased right eye blurriness, denied pain. She denied headaches or dizziness. She had no other concerns.  ? ?Vitals:  ? 04/11/21 1400 04/11/21 1500 04/11/21 1547 04/11/21 1600  ?BP:  (!) 127/58  106/65  ?Pulse: 76 67  70  ?Resp: '19 16  16  '$ ?Temp:   98.6 ?F (37 ?C)   ?TempSrc:   Oral   ?SpO2: 96% 98%  97%  ?Weight:      ?Height:      ? ?CBC:  ?Recent Labs  ?Lab 04/08/21 ?1214 04/08/21 ?1230 04/10/21 ?0409 04/11/21 ?0336  ?WBC 8.2   < > 9.9 10.1  ?NEUTROABS 5.5  --   --   --   ?HGB 15.9*   < > 11.9* 10.6*  ?HCT 45.8   < > 34.7* 31.1*  ?MCV 98.7   < > 101.2* 101.0*  ?PLT 317   < > 262 257  ? < > = values in this interval not displayed.  ? ?Basic Metabolic Panel:  ?Recent Labs  ?Lab 04/10/21 ?0409 04/11/21 ?0336  ?NA 140 136  ?K 2.9* 3.8  ?CL 105 107  ?CO2 26 22  ?GLUCOSE 102* 113*  ?BUN 6* 11  ?CREATININE 0.71 0.71  ?CALCIUM 8.0* 8.0*  ?MG 1.7  --   ? ?Lipid Panel:  ?Recent Labs  ?Lab 04/09/21 ?7824  ?CHOL 215*  ?TRIG 149  ?HDL 82  ?CHOLHDL 2.6  ?VLDL 30  ?LDLCALC 103*  ?  ?HgbA1c:  ?Recent Labs  ?Lab 04/09/21 ?2353  ?HGBA1C 5.5  ?  ?Urine Drug Screen: No results for input(s): LABOPIA, COCAINSCRNUR, LABBENZ, AMPHETMU, THCU, LABBARB in the last 168 hours.  ?Alcohol Level No results for input(s): ETH in the last 168 hours. ? ?IMAGING past 24 hours ?No results found. ? ?PHYSICAL EXAM ?Physical Exam  ?Constitutional: Appears well-developed and well-nourished.  ?Psych: Affect appropriate to situation ?Eyes: No scleral injection ?Respiratory: Effort normal, non-labored breathing ?Gait: unstable, right leg weakness ? ?Neuro: ?Mental Status: ?Patient is awake, alert, oriented to person, place, month, year, and situation. ?Patient no signs of aphasia or  neglect.  ?Cranial Nerves: ?II: Visual Fields are full. Pupils are equal, round, and reactive to light. OS legal blindness and OS glaucoma ?III,IV, VI: EOMI without ptosis or diploplia. ?V: Facial sensation is symmetric to temperature ?VII: Facial movement is symmetric resting and smiling ?VIII: Hearing is intact to voice ?XI: Shoulder shrug is symmetric. ?XII: Tongue protrudes midline without atrophy or fasciculations ?Motor: ?Tone is normal. Bulk is normal. 5/5 strength was present in left extremities and RUE. 4/5 strength in right lower extremities.  ?Sensory: ?Sensation is symmetric to light touch and temperature in the arms and legs. No extinction to DSS present.  ? ?ASSESSMENT/PLAN ?Ms. Charonda Hoiland is a 76 y.o. 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  Symptoms not improved on EMS arrival and nearly resolved in ER.  Patient had 2 similar episodes within the last 3 months. NIH score 0 but with slight clumsiness right hand and right leg.  CT no acute finding. CT head and neck done showed left ICA occlusion and likely acute left M2 occlusion.  Loaded with aspirin and the Plavix.  Put on statin.  MRI showed punctate left parietal infarct.  However, several hours later patient's symptoms getting worse with right-sided weakness and aphasia.  Repeat CTA showed left ICA partially recannulized with left M1 occlusion.  CTP 6/86.  Mechanical thrombectomy performed with TICI3 left M1 and left ICA angioplasty with 30% residual stenosis.  MRI showed left BG and caudate head infarct with scattered left MCA punctate infarcts, and mild petechial hemorrhage and SAH. ? ?Etiology of patient's stroke likely due to large vessel disease of left ICA high-grade stenosis.  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  Now  status post EVT with TICI3 MCA and angioplasty of left ICA.  Repeat carotid Doppler on 3/7, if left ICA still significant stenosis, may consider left ICA stenting.  Otherwise, patient much improved, will continue DAPT and statin.  PT/OT.  BP goal 120-140 within 24 hours of IR. Off Cleviprex, will transition out of ICU. ? 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 No evidence of acute ICH. ?CTA head: Intracranial L ICA occlusion throughout the petrous segment. There is reconstitution of enhancement within the intracranial L ICA beginning at the cavernous segment. Occlusion of proximal M2 L MCA vessel   ?CTA neck: L CCA is patent, however L ICA is occluded beginning at its origin and remains occluded throughout the remainder of the neck. R CCA, R IC, b/l vertebral arteries are patent without significant stenosis.   ?CT perfusion - 6/86 ?MRI: Punctate acute cortical infarct within the left parietal lobe (left MCA vascular territory). Small chronic cortically-based infarct within the lateral left frontoparietal lobes (left MCA vascular territory). ?Repeat MRI: New from the prior brain MRI of 04/08/2021, there are acute infarcts involving much of the left caudate and lentiform nuclei, as well as small portions of the left internal capsule. Mild petechial hemorrhage within the left basal ganglia. Several small acute cortically-based infarcts within the left frontal and parietal lobes, which are also new from the prior MRI (left MCA vascular territory). Additionally, there are a few new punctate foci of petechial hemorrhage within the high left parietal lobe. Signal abnormality along portions of the left frontal, parietal and temporal lobes, compatible with small-volume acute subarachnoid hemorrhage ?Carotid Doppler:  Completed, pending impression ?2D Echo: EF 60-65%. ?LDL 103 ?HgbA1c 5.5 ?VTE prophylaxis - lovenox and DAPT ?No antithrombotic prior to admission, now on aspirin 81 mg daily and clopidogrel 75 mg daily DAPT for 3 months and then ASA alone given left ICA stenosis  ?Therapy recommendations:  Outpatient PT/OT follow-up and DME rolling walker  ?Disposition: Home, pending workup ? ?Left carotid stenosis ?Pt has left CRAO 15 years ago ?Initial CTA showed left ICA occlusion ?Repeat CTA showed left ICA partially recannulized ?Status post angioplasty, showed ICA 30% stenosis remaining ?CUS per above ? ?Hypertension ?Home meds:  Amlodpine 5 mg daily, HCTZ 12.5 mg daily, losartan 100 mg daily. All held ?Stable ?Avoid low BP ?Long-term BP goal 120-150 given ICA stenosis ? ?Hyperlipidemia ?Home meds:  N/A ?LDL 103, goal < 70 ?Add Atorvastatin 40 mg  ?Continue statin at discharge ? ?Other Stroke Risk Factors ?Advanced Age >/= 34  ?Former Cigarette smoker, advised to continue abstinence  ?Hx stroke/TIA  ?Family  hx stroke (father) ?Coronary artery disease  ? ?Hospital day # 3 ? ?Signed: ?Merrily Brittle, DO ?Resident, PGY-1 ?[facility] ?04/11/2021, 4:35 PM  ? ?To contact Stroke Continuity provider, please refer to [email or web address] ?After hours, contact General Neurology  ?

## 2021-04-11 NOTE — Progress Notes (Signed)
Occupational Therapy Treatment ?Patient Details ?Name: Angela Jenkins ?MRN: [id number] ?DOB: [birth year] ?Today's Date: 04/11/2021 ? ? ?History of present illness 76 y.o. female presented to ED for episode of aphasia, right facial droop and right-sided weakness.  Symptoms not improved on EMS arrival and nearly resolved in ER.  Slight clumsiness right hand and right leg.  CT no acute finding. CT head and neck done showed left ICA occlusion and likely acute left M2 occlusion.MRI showed punctate left parietal infarct. Several hours later patient's symptoms getting worse with right-sided weakness and aphasia.  Repeat CTA showed left ICA partially recannulized with left M1 occlusion. Mechanical thrombectomy performed with TICI3 left M1 and left ICA angioplasty with 30% residual stenosis.  MRI: left BG and caudate head infarct with scattered left MCA punctate infarcts, and mild petechial hemorrhage and SAH. 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  ?OT comments ? Pt seen to further assess cognition and functional mobility. Pt demonstrates R inattention as indicated by falling over to her R x 3 during the cognitive assessment where she was focused on the task. Also noted "clumsiness " with R hand and dragging R foot during mobility. Recommend follow up OT at a neuro outpt center to maximize functional level of independence with IADL tasks and reduce risk of falls. Warning signs/symptoms of CVA used during Be[employer]. Acute OT to continue to follow.   ? ?Recommendations for follow up therapy are one component of a multi-disciplinary discharge planning process, led by the attending physician.  Recommendations may be updated based on patient status, additional functional criteria and insurance authorization. ?   ?Follow Up Recommendations ? Outpatient OT  ?  ?Assistance Recommended at Discharge Intermittent Supervision/Assistance  ?Patient can return home with the following ? A little help with  bathing/dressing/bathroom;A little help with walking and/or transfers;Assistance with cooking/housework;Assist for transportation;Help with stairs or ramp for entrance;Direct supervision/assist for medications management;Direct supervision/assist for financial management ?  ?Equipment Recommendations ? None recommended by OT  ?  ?Recommendations for Other Services   ? ?  ?Precautions / Restrictions Precautions ?Precautions: Fall ?Precaution Comments: legally blind in L eye  ? ? ?  ? ?Mobility Bed Mobility ?Overal bed mobility: Needs Assistance ?Bed Mobility: Supine to Sit ?  ?  ?Supine to sit: Modified independent (Device/Increase time) ?  ?  ?  ?  ? ?Transfers ?  ?  ?Transfers: Sit to/from Stand, Bed to chair/wheelchair/BSC ?Sit to Stand: Supervision ?  ?  ?Step pivot transfers: Min guard ?  ?  ?  ?  ?  ?Balance Overall balance assessment: Needs assistance ?  ?Sitting balance-Leahy Scale: Good ?  ?  ?  ?Standing balance-Leahy Scale: Fair ?  ?  ?  ?  ?  ?  ?  ?  ?  ?  ?  ?  ?   ? ?ADL either performed or assessed with clinical judgement  ? ?ADL   ?  ?  ?  ?  ?  ?  ?  ?  ?  ?  ?  ?  ?Toilet Transfer: Min guard ?  ?  ?  ?  ?  ?  ?General ADL Comments: overall mingard A for LB ADL; Pt drags R foot during mobility ?  ? ?Extremity/Trunk Assessment Upper Extremity Assessment ?Upper Extremity Assessment: RUE deficits/detail ?RUE Deficits / Details: mild deficits noted "clumsy" at times; pt is R hand dominanat however used her L hand 80% of  task; unaware of position of R hand when transitioning back to bed ?RUE Sensation: decreased light touch;decreased proprioception ?RUE Coordination: decreased fine motor (decreased in-hand manipulation skills) ?  ?Lower Extremity Assessment ?Lower Extremity Assessment: Defer to PT evaluation ?  ?  ?  ? ?Vision   ?Additional Comments: legally blind L eye; reports vision is "blurry" in distance vision; able to read without difficulty Recommend follow up with her eye doctor ?  ?Perception  Perception ?Perception:  (R inattention) ?  ?Praxis Praxis ?Praxis: Intact ?  ? ?Cognition Arousal/Alertness: Awake/alert ?Behavior During Therapy: [facility] for tasks assessed/performed ?Overall Cognitive Status: Impaired/Different from baseline ?Area of Impairment: Attention, Awareness, Safety/judgement ?  ?  ?  ?  ?  ?  ?  ?  ?  ?Current Attention Level: Selective ?  ?  ?Safety/Judgement: Decreased awareness of deficits ?Awareness: Emergent ?  ?General Comments: Assessed with the {PillBox test of executive level functioning and medication management. Pt received a passing score, however it took her 15 min to finiahs the evaluation which is suppose to be completed in 5 min. During the assessment, pt fell toward her R side in sitting x 3 due to apparent focused attentionon task and decreased attention on potural control. 1 error noted in sequential subtraciton ?  ?  ?   ?Exercises   ? ?  ?Shoulder Instructions   ? ? ?  ?General Comments    ? ? ?Pertinent Vitals/ Pain       Pain Assessment ?Pain Assessment: No/denies pain ? ?Home Living   ?  ?  ?  ?  ?  ?  ?  ?  ?  ?  ?  ?  ?  ?  ?  ?  ?  ?  ? ?  ?Prior Functioning/Environment    ?  ?  ?  ?   ? ?Frequency ? Min 2X/week  ? ? ? ? ?  ?Progress Toward Goals ? ?OT Goals(current goals can now be found in the care plan section) ? Progress towards OT goals: Progressing toward goals ? ?Acute Rehab OT Goals ?Patient Stated Goal: to go home today ?OT Goal Formulation: With patient ?Time For Goal Achievement: 04/24/21 ?Potential to Achieve Goals: Good ?ADL Goals ?Pt Will Perform Grooming: Independently;standing ?Pt Will Perform Upper Body Bathing: Independently;sitting;standing ?Pt Will Perform Lower Body Bathing: Independently;sit to/from stand ?Pt Will Perform Upper Body Dressing: Independently;standing;sitting ?Pt Will Perform Lower Body Dressing: Independently;sit to/from stand ?Pt Will Transfer to Toilet: Independently;ambulating;regular height toilet ?Pt Will Perform Toileting  - Clothing Manipulation and hygiene: Independently;sit to/from stand  ?Plan Discharge plan needs to be updated   ? ?Co-evaluation ? ? ?   ?  ?  ?  ?  ? ?  ?AM-PAC OT "6 Clicks" Daily Activity     ?Outcome Measure ? ? Help from another person eating meals?: None ?Help from another person taking care of personal grooming?: A Little ?Help from another person toileting, which includes using toliet, bedpan, or urinal?: A Little ?Help from another person bathing (including washing, rinsing, drying)?: A Little ?Help from another person to put on and taking off regular upper body clothing?: A Little ?Help from another person to put on and taking off regular lower body clothing?: A Little ?6 Click Score: 19 ? ?  ?End of Session Equipment Utilized During Treatment: Gait belt ? ?OT Visit Diagnosis: Unsteadiness on feet (R26.81);Other symptoms and signs involving cognitive function;Muscle weakness (generalized) (M62.81) ?  ?Activity Tolerance Patient tolerated treatment well ?  ?  Patient Left in chair;with call bell/phone within reach;with chair alarm set ?  ?Nurse Communication Mobility status;Other (comment) (DC recommendations) ?  ? ?   ? ?Time: 6237-6283 ?OT Time Calculation (min): 41 min ? ?Charges: OT General Charges ?$OT Visit: 1 Visit ?OT Treatments ?$Self Care/Home Management : 8-22 mins ?$Therapeutic Activity: 23-37 mins ? ?[facility], OT/L  ? ?Acute OT Clinical Specialist ?Acute Rehabilitation Services ?Pager [phone] ?Office [phone]  ? ?Angela Jenkins,Angela Jenkins ?04/11/2021, 3:17 PM ?

## 2021-04-11 NOTE — Discharge Instructions (Signed)
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  We had a procedure open up the vessel on the left brain and also left carotid artery.  However, left carotid artery still has 70% narrowing, interventional radiology Dr. Estanislado Pandy will see you in 7 to 10 days at office to schedule further treatment on the left carotid artery. ?If you did not get a phone call from him, please call back the number on your discharge paper to schedule appointment. ?Continue aspirin 81 mg and Plavix 75 mg daily, continue Lipitor 40 mg daily. ?Avoid low blood pressure, your blood pressure goal 120-140.  Currently, we will hold off all your blood pressure medications at this time since your blood pressure is in the goal range.  Check blood pressure at home and monitor.  If not in the goal range persistently, please call your family doctor to make medication arrangement. ?Recommend home rest for 1 to 2 weeks with light activity, after that gradually increase your activity but do not over exercising. ?Scheduled to follow-up with your family doctor in 2 weeks. ?You will have outpatient physical therapy appointment for further rehab. ?You will follow-up with neurologist at [facility] in about 1 months.  The office number is in your discharge paperwork, if you do not get a call for appointment please call them back. ?Any recurrent strokelike symptoms, please call 911 or go to the nearest emergency room for evaluation. ?

## 2021-04-11 NOTE — Progress Notes (Signed)
Carotid duplex bilateral study completed.   Please see CV Proc for preliminary results.   Akaylah Lalley, RDMS, RVT  

## 2021-04-12 ENCOUNTER — Emergency Department (HOSPITAL_COMMUNITY)
Admission: EM | Admit: 2021-04-12 | Discharge: 2021-05-06 | Disposition: E | Payer: Medicare HMO | Attending: Emergency Medicine | Admitting: Emergency Medicine

## 2021-04-12 DIAGNOSIS — R4182 Altered mental status, unspecified: Secondary | ICD-10-CM | POA: Diagnosis not present

## 2021-04-12 DIAGNOSIS — R0603 Acute respiratory distress: Secondary | ICD-10-CM | POA: Diagnosis not present

## 2021-04-12 DIAGNOSIS — R0902 Hypoxemia: Secondary | ICD-10-CM | POA: Diagnosis not present

## 2021-04-12 DIAGNOSIS — Z66 Do not resuscitate: Secondary | ICD-10-CM | POA: Diagnosis not present

## 2021-04-12 DIAGNOSIS — R58 Hemorrhage, not elsewhere classified: Secondary | ICD-10-CM | POA: Diagnosis not present

## 2021-04-12 DIAGNOSIS — I629 Nontraumatic intracranial hemorrhage, unspecified: Secondary | ICD-10-CM | POA: Diagnosis not present

## 2021-04-12 DIAGNOSIS — I616 Nontraumatic intracerebral hemorrhage, multiple localized: Secondary | ICD-10-CM | POA: Diagnosis not present

## 2021-04-12 DIAGNOSIS — Z9911 Dependence on respirator [ventilator] status: Secondary | ICD-10-CM | POA: Insufficient documentation

## 2021-04-12 DIAGNOSIS — R0681 Apnea, not elsewhere classified: Secondary | ICD-10-CM | POA: Diagnosis not present

## 2021-04-12 DIAGNOSIS — R569 Unspecified convulsions: Secondary | ICD-10-CM | POA: Diagnosis not present

## 2021-04-12 DIAGNOSIS — Z20822 Contact with and (suspected) exposure to covid-19: Secondary | ICD-10-CM | POA: Diagnosis not present

## 2021-04-12 DIAGNOSIS — R930 Abnormal findings on diagnostic imaging of skull and head, not elsewhere classified: Secondary | ICD-10-CM | POA: Diagnosis not present

## 2021-04-12 DIAGNOSIS — Z7982 Long term (current) use of aspirin: Secondary | ICD-10-CM | POA: Insufficient documentation

## 2021-04-12 DIAGNOSIS — R402432 Glasgow coma scale score 3-8, at arrival to emergency department: Secondary | ICD-10-CM | POA: Diagnosis not present

## 2021-04-12 DIAGNOSIS — Z7902 Long term (current) use of antithrombotics/antiplatelets: Secondary | ICD-10-CM | POA: Diagnosis not present

## 2021-04-12 DIAGNOSIS — R404 Transient alteration of awareness: Secondary | ICD-10-CM | POA: Diagnosis not present

## 2021-04-12 MED ORDER — NICARDIPINE HCL IN NACL 20-0.86 MG/200ML-% IV SOLN
INTRAVENOUS | Status: AC
  Filled 2021-04-12: qty 200

## 2021-04-12 MED ORDER — PROPOFOL 1000 MG/100ML IV EMUL
INTRAVENOUS | Status: AC
  Filled 2021-04-12: qty 100

## 2021-04-12 NOTE — Progress Notes (Signed)
RT Note: Patient arrived via [facility] from [facility] on mechanical ventilation. Placed on 95m/kg.  ? ?

## 2021-04-13 DIAGNOSIS — R000 Tachycardia, unspecified: Secondary | ICD-10-CM | POA: Diagnosis not present

## 2021-04-13 DIAGNOSIS — R55 Syncope and collapse: Secondary | ICD-10-CM | POA: Diagnosis not present

## 2021-04-13 LAB — RESP PANEL BY RT-PCR (FLU A&B, COVID) ARPGX2
Influenza A by PCR: NEGATIVE
Influenza B by PCR: NEGATIVE
SARS Coronavirus 2 by RT PCR: NEGATIVE

## 2021-04-17 ENCOUNTER — Ambulatory Visit: Payer: Medicare HMO | Admitting: Occupational Therapy

## 2021-04-17 ENCOUNTER — Ambulatory Visit: Payer: Medicare HMO | Admitting: Physical Therapy

## 2021-05-06 NOTE — Progress Notes (Signed)
?   Apr 27, 2021 0014  ?Clinical Encounter Type  ?Visited With Patient and family together  ?Visit Type Initial;ED;Patient actively dying  ?Referral From Nurse  ?Consult/Referral To None  ? ? ?Chaplain responded to call for a chaplain, patient was placed on comfort care. Chaplain spoke with spouse who reflected that this was not what he expected but not completely unexpected. He had made family aware of the situation and now he wishes to sit quietly with his wife. I spoke to the patient and assured spouse that I would stop back in but if he wished to speak with me just let his nurse know. I would return. ? ?Danice Goltz  ?Chaplain Resident  ?[facility]  ?[phone] ? ?

## 2021-05-06 NOTE — ED Triage Notes (Signed)
Pt arrives to ED [facility] transferred from [facility]. Per [place] pt was found down on at home unresponsive, was taken to [facility]. Imaging showed brain bleed and was transferred to [facility] ED. Pt arrived intubated and on Propofol 40mg and Cardien 2.'5mg'$ . ?

## 2021-05-06 NOTE — Progress Notes (Signed)
Patient extubated to 21% with MD at bedside. ?

## 2021-05-06 NOTE — ED Notes (Signed)
Time of Death 22 Pronounced by Karle Starch, MD.  ?

## 2021-05-06 NOTE — ED Notes (Signed)
Drips stopped and pt extubated. Karle Starch, MD and RT at bedside. ?

## 2021-05-06 NOTE — ED Provider Notes (Signed)
? ?Angela Jenkins  ?Provider Note ? ?CSN: [id number] ?Arrival date & time: 04/27/2021 2332 ? ?History ?Chief Complaint  ?Patient presents with  ? Altered Mental Status  ? ? ?Angela Jenkins is a 76 y.o. female brought to the ED as a transfer from [facility] where was initially seen for AMS. She was recently at [facility] for a stroke, had TNK and a thrombectomy, discharged yesterday. She was apparently found down at home, unclear time. Found to have a large ICH at OSH, intubated for airway protection and sent here for definitive care. ? ? ?Home Medications ?Prior to Admission medications   ?Medication Sig Start Date End Date Taking? Authorizing Provider  ?aspirin 81 MG chewable tablet Chew 1 tablet (81 mg total) by mouth daily. 04/14/2021   Rosalin Hawking, MD  ?atorvastatin (LIPITOR) 40 MG tablet Take 1 tablet (40 mg total) by mouth daily. 04/17/2021   Rosalin Hawking, MD  ?clopidogrel (PLAVIX) 75 MG tablet Take 1 tablet (75 mg total) by mouth daily. 04/12/2021   Rosalin Hawking, MD  ?denosumab (PROLIA) 60 MG/ML SOSY injection Inject 60 mg into the skin every 6 (six) months.    [provider]  ?dorzolamide-timolol (COSOPT) 22.3-6.8 MG/ML ophthalmic solution Place 1 drop into both eyes 2 (two) times daily.    [provider]  ?latanoprost (XALATAN) 0.005 % ophthalmic solution Place 1 drop into both eyes at bedtime.    [provider]  ?VITAMIN D PO Take 20,000 Units by mouth once a week. Wednesdays    [provider]  ? ? ? ?Allergies    ?Brimonidine ? ? ?Review of Systems   ?Review of Systems ?Please see HPI for pertinent positives and negatives ? ?Physical Exam ?BP (!) 161/79   Pulse 60   Resp (!) 0   SpO2 90%  ? ?Physical Exam ?Vitals and nursing note reviewed.  ?HENT:  ?   Head: Normocephalic and atraumatic.  ?   Nose: Nose normal.  ?   Mouth/Throat:  ?   Mouth: Mucous membranes are moist.  ?Eyes:  ?   Comments: Pupils are large and unreactive  ?Cardiovascular:  ?   Rate and Rhythm:  Normal rate.  ?Pulmonary:  ?   Breath sounds: Normal breath sounds.  ?   Comments: intubated ?Abdominal:  ?   General: Abdomen is flat.  ?   Palpations: Abdomen is soft.  ?   Tenderness: There is no abdominal tenderness.  ?Musculoskeletal:     ?   General: No signs of injury.  ?   Cervical back: Neck supple.  ?Skin: ?   General: Skin is warm and dry.  ?Neurological:  ?   Comments: Unresponsive to verbal or painful stimuli  ?Psychiatric:  ?   Comments: Unable to assess  ? ? ?ED Results / Procedures / Treatments   ?EKG ?None ? ?Procedures ?Procedures ? ?Medications Ordered in the ED ?Medications  ?propofol (DIPRIVAN) 1000 MG/100ML infusion (  Not Given Apr 16, 2021 0032)  ?niCARdipine in saline (CARDENE-IV) 20-0.86 MG/200ML-% infusion SOLN (  Not Given April 16, 2021 0034)  ? ? ?Initial Impression and Plan ? Patient with recent TNK and thrombectomy transferred here from OSH for [place], intubated for airway protection. Will discuss with local neurology.  ? ?ED Course  ? ?Clinical Course as of Apr 16, 2021 0100  ?16-Apr-2021  ?0000 I personally viewed the images from radiology studies and agree with radiologist interpretation: Large parenchymal bleed with extension into the ventricle. Reviewed with Dr. Theda Sers,  Neurology, who agrees that this is not a survivable bleed.  ? [CS]  ?4 Spoke with husband at bedside, reviewed CT findings and my discussion with Dr. Theda Sers. He would like to make the patient comfort care as there is not an intervention that would help her. She had discussed with him in the past that she would not want to be incapacitated if there is no hope for recovery. She will be DNR and he would like to proceed with extubation for comfort care.  [CS]  ?4917 Patient extubated without difficulty. Propofol and Cardene drips were stopped. Will continue to monitor for expected progression.  [CS]  ?0053 Patient's HR has dropped into the 30s, she is pulseless, apneic and without heart sounds on auscultation. Time of death  87hrs. Husband is at bedside. Offered chaplain services but he declined. Patient will not be an ME case. Death Certificate filled out online.  [CS]  ?  ?Clinical Course User Index ?[CS] Truddie Hidden, MD  ? ? ? ?MDM Rules/Calculators/A&P ?Medical Decision Making ?Problems Addressed: ?Intracranial hemorrhage ([place]): acute illness or injury that poses a threat to life or bodily functions ? ?Amount and/or Complexity of Data Reviewed ?Independent Historian: spouse ? ?Risk ?Decision not to resuscitate or to de-escalate care because of poor prognosis. ? ?Critical Care ?Total time providing critical care: 45 minutes ? ? ?Final Clinical Impression(s) / ED Diagnoses ?Final diagnoses:  ?Intracranial hemorrhage ([place])  ? ? ?Rx / DC Orders ?ED Discharge Orders   ? ? None  ? ?  ? ?  ?Truddie Hidden, MD ?04-17-2021 0101 ? ?

## 2021-05-06 NOTE — ED Notes (Signed)
Rings are with security. Patients Husband and Son have been notified and will come tomorrow to pick up Rings. ?

## 2021-05-06 DEATH — deceased

## 2023-02-25 IMAGING — MR MR HEAD W/O CM
12 of 13 series · 41 of 48 positions shown · non-contrast
Comparison: Prior CTA head/neck examinations 04/08/2021, prior
noncontrast head CT examinations 04/08/2021, prior brain MRI
04/08/2021. Postprocedural head CT performed yesterday.
COMPARISON: Prior CTA head/neck examinations 04/08/2021, prior
noncontrast head CT examinations 04/08/2021, prior brain MRI
04/08/2021. Postprocedural head CT performed yesterday.

Addendum:
CLINICAL DATA: Provided history: Neuro deficit, acute, stroke
suspected.

EXAM:
MRI HEAD WITHOUT CONTRAST
TECHNIQUE: Multiplanar, multiecho pulse sequences of the brain and surrounding
structures were obtained without intravenous contrast.

[Series 5: DWI · axial · 3.0mm · 0.88mm/px · z∈[-120,+30]mm · 8 of 104 slices shown (1 of 4)]
[im 1/104]
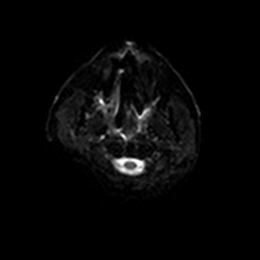
[im 15/104]
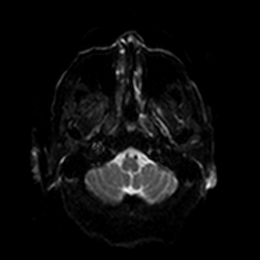
[im 30/104]
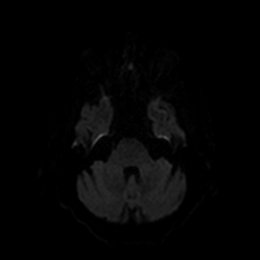
[im 45/104]
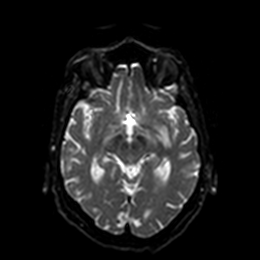
[im 59/104]
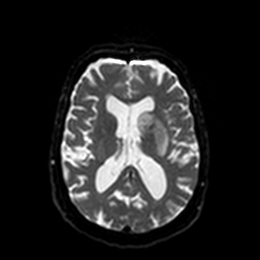
[im 74/104]
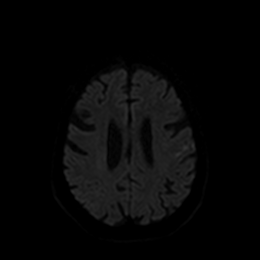
[im 89/104]
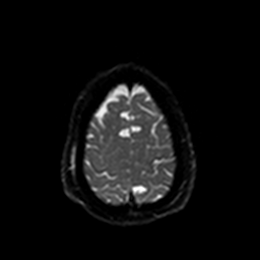
[im 104/104]
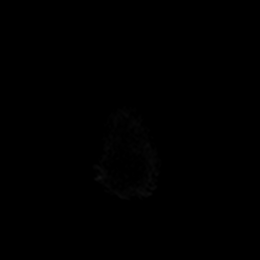

[Series 6: DWI · axial · 3.0mm · 0.88mm/px · z∈[-120,+30]mm · 4 of 52 slices shown (2 of 4)]
[im 1/52]
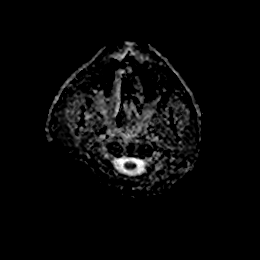
[im 18/52]
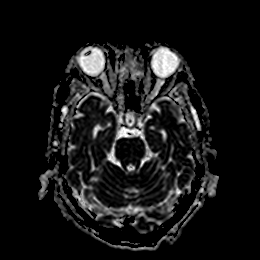
[im 35/52]
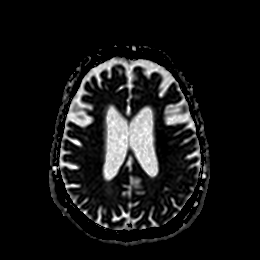
[im 52/52]
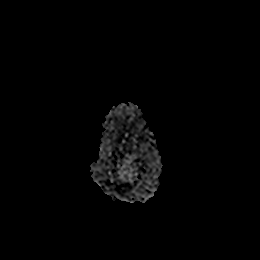

[Series 7: DWI · coronal · 4.0mm · 0.88mm/px · 5 of 70 slices shown (3 of 4)]
[im 1/70]
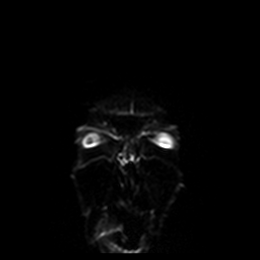
[im 18/70]
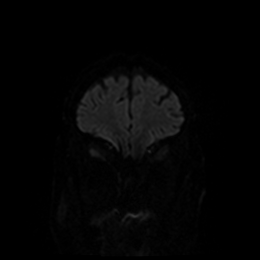
[im 35/70]
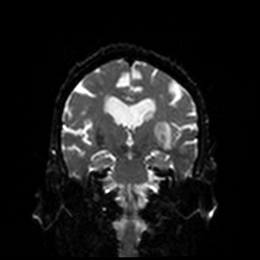
[im 52/70]
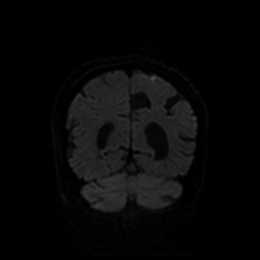
[im 70/70]
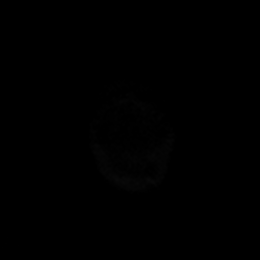

[Series 8: DWI · coronal · 4.0mm · 0.88mm/px · 3 of 35 slices shown (4 of 4)]
[im 1/35]
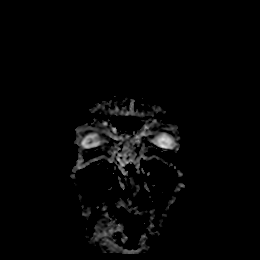
[im 18/35]
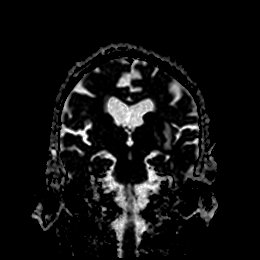
[im 35/35]
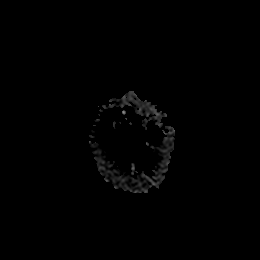

[Series 9: T1 · sagittal · 5.0mm · 0.75mm/px · 2 of 23 slices shown]
[im 1/23]
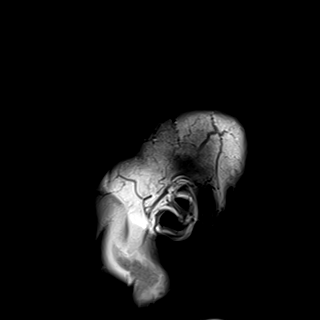
[im 23/23]
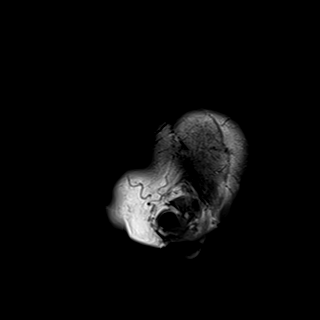

[Series 10: T2 · axial · 5.0mm · 0.72mm/px · z∈[-126,+27]mm · 2 of 27 slices shown (1 of 2)]
[im 1/27]
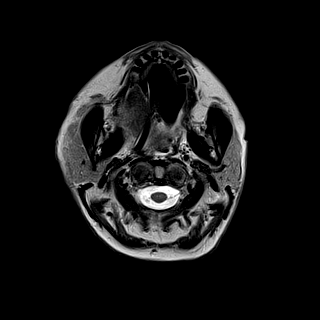
[im 27/27]
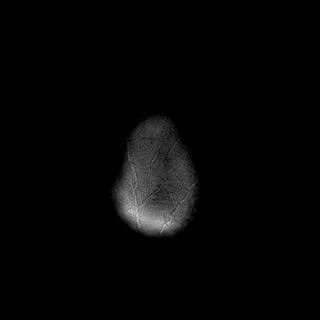

[Series 11: FLAIR · axial · 5.0mm · 0.45mm/px · z∈[-123,+30]mm · 2 of 27 slices shown]
[im 1/27]
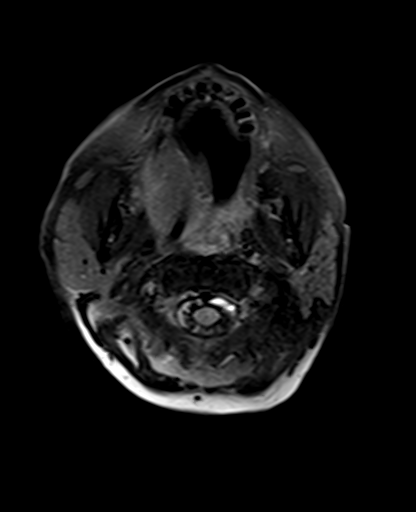
[im 27/27]
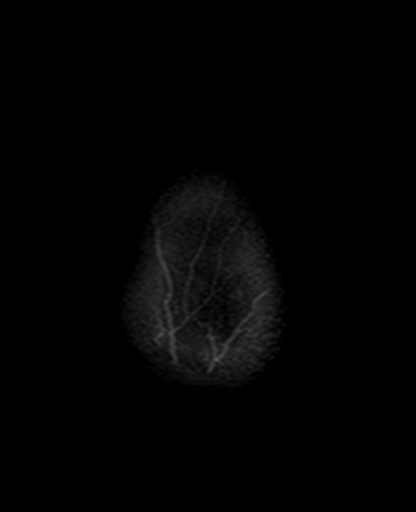

[Series 12: mag_images · axial · 3.0mm · 0.90mm/px · z∈[-126,+36]mm · 4 of 56 slices shown]
[im 1/56]
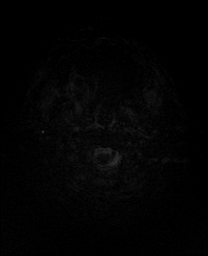
[im 19/56]
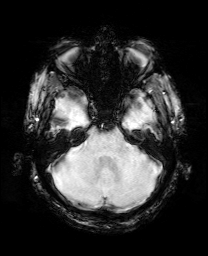
[im 37/56]
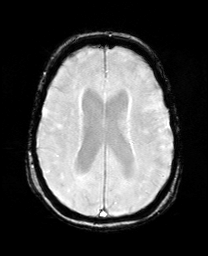
[im 56/56]
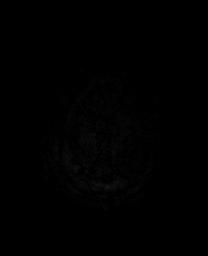

[Series 13: pha_images · axial · 3.0mm · 0.90mm/px · z∈[-126,+36]mm · 4 of 56 slices shown]
[im 1/56]
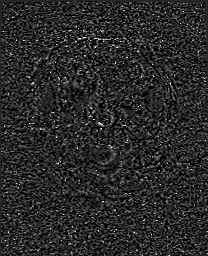
[im 19/56]
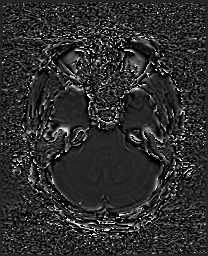
[im 37/56]
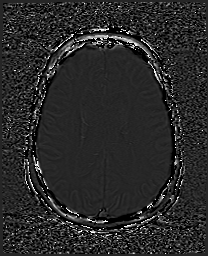
[im 56/56]
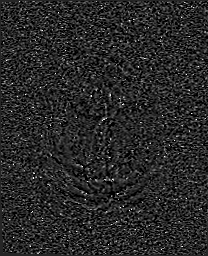

[Series 14: swi_images · axial · 3.0mm · 0.90mm/px · z∈[-126,+36]mm · 4 of 56 slices shown]
[im 1/56]
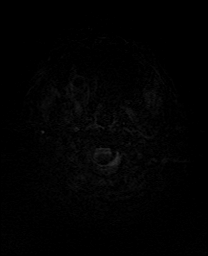
[im 19/56]
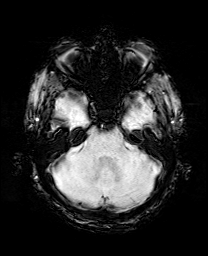
[im 37/56]
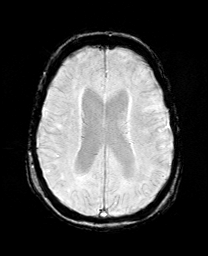
[im 56/56]
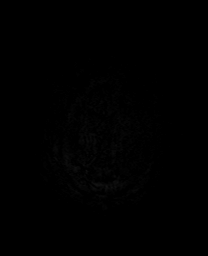

[Series 15: mip_images(sw) · axial · 24.0mm · 0.90mm/px · 1 of 49 slices shown]
[im 1/49]
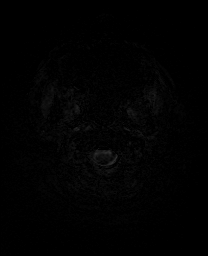

[Series 17: T2 · coronal · 5.0mm · 0.34mm/px · 2 of 29 slices shown (2 of 2)]
[im 1/29]
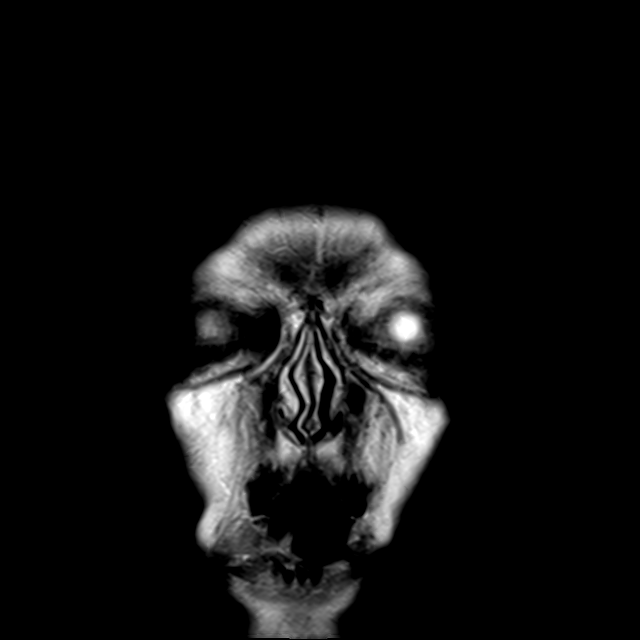
[im 29/29]
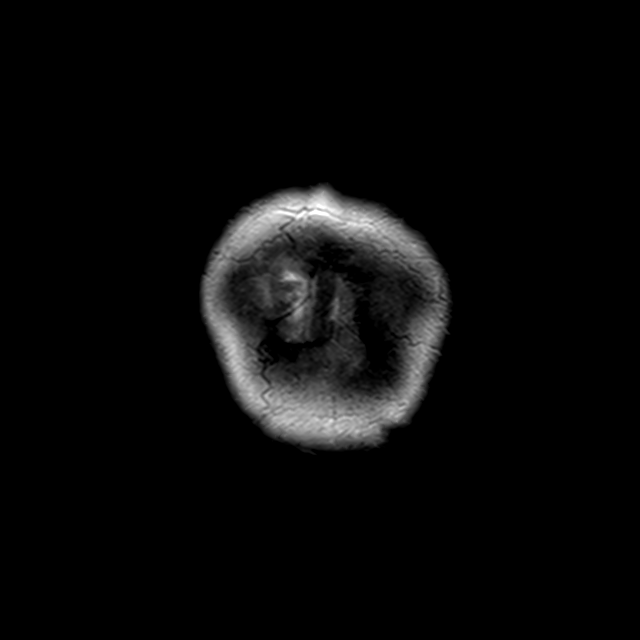

[41 of 48 positions shown; findings below may reference images not displayed]

FINDINGS: Brain:

Mild-to-moderate generalized cerebral and cerebellar atrophy.

New from the prior brain MRI of 04/08/2021, there are acute infarcts
involving much of the left caudate and lentiform nuclei, and likely
small portions of the left internal capsule. Slight regional mass
effect without significant ventricular effacement or midline shift.
Mild petechial hemorrhage within the left basal ganglia.

There are also new small cortically based infarcts within the left
frontal and parietal lobes (left MCA vascular territory).

There are a few punctate foci of petechial hemorrhage within the
high left parietal lobe, also new from the prior MRI.

There is T2 FLAIR hyperintense signal abnormality and
susceptibility-weighted signal loss along portions of the left
frontal, parietal and temporal lobes, compatible with small-volume
acute subarachnoid hemorrhage.

Redemonstrated small chronic cortical infarct within the lateral
left frontoparietal lobes.

Mild multifocal T2 FLAIR hyperintense signal abnormality within the
cerebral white matter, nonspecific but compatible with chronic small
vessel ischemic disease.

No evidence of an intracranial mass.

No evidence of hydrocephalus.

Vascular: Maintained flow voids within the proximal large arterial
vessels.

Skull and upper cervical spine: No focal suspicious marrow lesion.

Sinuses/Orbits: Visualized orbits show no acute finding. Prior right
ocular lens replacement. Mild mucosal thickening within the
bilateral ethmoid, right sphenoid and bilateral maxillary sinuses.
Small mucous retention cyst within the left sphenoid sinus.
IMPRESSION: New from the prior brain MRI of 04/08/2021, there are acute infarcts
involving much of the left caudate and lentiform nuclei, as well as
small portions of the left internal capsule. Slight regional mass
effect without ventricular effacement or midline shift. Mild
petechial hemorrhage within the left basal ganglia.

There are several small acute cortically-based infarcts within the
left frontal and parietal lobes, which are also new from the prior
MRI (left MCA vascular territory). Additionally, there are a few new
punctate foci of petechial hemorrhage within the high left parietal
lobe.

Signal abnormality along portions of the left frontal, parietal and
temporal lobes, compatible with small-volume acute subarachnoid
hemorrhage.

Chronic findings without interval change, as described.

Paranasal sinus disease, as outlined.

ADDENDUM:
These results were called by telephone at the time of interpretation
on 04/09/2021 at [DATE] to provider Dr. Heaven, who verbally
acknowledged these results.

*** End of Addendum ***
FINDINGS: Brain:

Mild-to-moderate generalized cerebral and cerebellar atrophy.

New from the prior brain MRI of 04/08/2021, there are acute infarcts
involving much of the left caudate and lentiform nuclei, and likely
small portions of the left internal capsule. Slight regional mass
effect without significant ventricular effacement or midline shift.
Mild petechial hemorrhage within the left basal ganglia.

There are also new small cortically based infarcts within the left
frontal and parietal lobes (left MCA vascular territory).

There are a few punctate foci of petechial hemorrhage within the
high left parietal lobe, also new from the prior MRI.

There is T2 FLAIR hyperintense signal abnormality and
susceptibility-weighted signal loss along portions of the left
frontal, parietal and temporal lobes, compatible with small-volume
acute subarachnoid hemorrhage.

Redemonstrated small chronic cortical infarct within the lateral
left frontoparietal lobes.

Mild multifocal T2 FLAIR hyperintense signal abnormality within the
cerebral white matter, nonspecific but compatible with chronic small
vessel ischemic disease.

No evidence of an intracranial mass.

No evidence of hydrocephalus.

Vascular: Maintained flow voids within the proximal large arterial
vessels.

Skull and upper cervical spine: No focal suspicious marrow lesion.

Sinuses/Orbits: Visualized orbits show no acute finding. Prior right
ocular lens replacement. Mild mucosal thickening within the
bilateral ethmoid, right sphenoid and bilateral maxillary sinuses.
Small mucous retention cyst within the left sphenoid sinus.
IMPRESSION: New from the prior brain MRI of 04/08/2021, there are acute infarcts
involving much of the left caudate and lentiform nuclei, as well as
small portions of the left internal capsule. Slight regional mass
effect without ventricular effacement or midline shift. Mild
petechial hemorrhage within the left basal ganglia.

There are several small acute cortically-based infarcts within the
left frontal and parietal lobes, which are also new from the prior
MRI (left MCA vascular territory). Additionally, there are a few new
punctate foci of petechial hemorrhage within the high left parietal
lobe.

Signal abnormality along portions of the left frontal, parietal and
temporal lobes, compatible with small-volume acute subarachnoid
hemorrhage.

Chronic findings without interval change, as described.

Paranasal sinus disease, as outlined.
